# Patient Record
Sex: Male | Born: 1965 | Race: Black or African American | Hispanic: No | Marital: Single | Smoking: Current every day smoker
Health system: Southern US, Community
[De-identification: ages and names within clinical notes are randomized; demographics above are authoritative.]

## PROBLEM LIST (undated history)

## (undated) DIAGNOSIS — E039 Hypothyroidism, unspecified: Secondary | ICD-10-CM

## (undated) DIAGNOSIS — R55 Syncope and collapse: Secondary | ICD-10-CM

## (undated) HISTORY — PX: DENTAL RESTORATION/EXTRACTION WITH X-RAY: SHX5796

## (undated) HISTORY — DX: Syncope and collapse: R55

## (undated) HISTORY — DX: Hypothyroidism, unspecified: E03.9

---

## 2008-02-06 ENCOUNTER — Emergency Department (HOSPITAL_COMMUNITY): Admission: EM | Admit: 2008-02-06 | Discharge: 2008-02-06 | Payer: Self-pay | Admitting: Emergency Medicine

## 2008-03-21 ENCOUNTER — Ambulatory Visit: Payer: Self-pay | Admitting: Internal Medicine

## 2008-03-21 LAB — CONVERTED CEMR LAB
Albumin: 4.6 g/dL (ref 3.5–5.2)
BUN: 11 mg/dL (ref 6–23)
CO2: 25 meq/L (ref 19–32)
Calcium: 9.2 mg/dL (ref 8.4–10.5)
Creatinine, Ser: 1.61 mg/dL — ABNORMAL HIGH (ref 0.40–1.50)
Glucose, Bld: 100 mg/dL — ABNORMAL HIGH (ref 70–99)
Microalb, Ur: 0.94 mg/dL (ref 0.00–1.89)
Total Protein: 7.6 g/dL (ref 6.0–8.3)

## 2008-04-18 ENCOUNTER — Ambulatory Visit: Payer: Self-pay | Admitting: Internal Medicine

## 2008-04-18 LAB — CONVERTED CEMR LAB
ALT: 17 units/L (ref 0–53)
Alkaline Phosphatase: 88 units/L (ref 39–117)
Basophils Absolute: 0 10*3/uL (ref 0.0–0.1)
Basophils Relative: 0 % (ref 0–1)
Eosinophils Relative: 2 % (ref 0–5)
HCT: 46.4 % (ref 39.0–52.0)
Hemoglobin: 14.1 g/dL (ref 13.0–17.0)
Lymphocytes Relative: 28 % (ref 12–46)
Lymphs Abs: 1.9 10*3/uL (ref 0.7–4.0)
MCHC: 30.4 g/dL (ref 30.0–36.0)
Monocytes Absolute: 0.5 10*3/uL (ref 0.1–1.0)
Neutro Abs: 4.4 10*3/uL (ref 1.7–7.7)
Neutrophils Relative %: 63 % (ref 43–77)
Platelets: 242 10*3/uL (ref 150–400)
RBC: 5.2 M/uL (ref 4.22–5.81)
RDW: 15 % (ref 11.5–15.5)
Sodium: 141 meq/L (ref 135–145)
Total Protein: 7 g/dL (ref 6.0–8.3)
WBC: 6.9 10*3/uL (ref 4.0–10.5)

## 2010-01-20 ENCOUNTER — Inpatient Hospital Stay (HOSPITAL_COMMUNITY): Admission: EM | Admit: 2010-01-20 | Discharge: 2010-01-20 | Payer: Self-pay | Admitting: Emergency Medicine

## 2010-08-22 LAB — URINALYSIS, ROUTINE W REFLEX MICROSCOPIC
Ketones, ur: NEGATIVE mg/dL
Nitrite: NEGATIVE

## 2010-08-22 LAB — POCT I-STAT, CHEM 8
Calcium, Ion: 1.05 mmol/L — ABNORMAL LOW (ref 1.12–1.32)
Chloride: 108 mEq/L (ref 96–112)
Creatinine, Ser: 1.8 mg/dL — ABNORMAL HIGH (ref 0.4–1.5)
Potassium: 3.8 mEq/L (ref 3.5–5.1)
Sodium: 141 mEq/L (ref 135–145)

## 2010-08-22 LAB — CBC
Hemoglobin: 11.9 g/dL — ABNORMAL LOW (ref 13.0–17.0)
Hemoglobin: 14.8 g/dL (ref 13.0–17.0)
MCH: 29.3 pg (ref 26.0–34.0)
MCH: 29.7 pg (ref 26.0–34.0)
MCHC: 34.3 g/dL (ref 30.0–36.0)
MCV: 85.7 fL (ref 78.0–100.0)
RBC: 4.06 MIL/uL — ABNORMAL LOW (ref 4.22–5.81)
RBC: 4.98 MIL/uL (ref 4.22–5.81)
RDW: 13.3 % (ref 11.5–15.5)
WBC: 13.8 10*3/uL — ABNORMAL HIGH (ref 4.0–10.5)

## 2010-08-22 LAB — BASIC METABOLIC PANEL
CO2: 25 mEq/L (ref 19–32)
Calcium: 8 mg/dL — ABNORMAL LOW (ref 8.4–10.5)
Creatinine, Ser: 1.77 mg/dL — ABNORMAL HIGH (ref 0.4–1.5)
GFR calc non Af Amer: 42 mL/min — ABNORMAL LOW (ref >60)
Glucose, Bld: 107 mg/dL — ABNORMAL HIGH (ref 70–99)
Potassium: 4.1 mEq/L (ref 3.5–5.1)
Sodium: 143 mEq/L (ref 135–145)

## 2010-08-22 LAB — RAPID URINE DRUG SCREEN, HOSP PERFORMED
Amphetamines: NOT DETECTED
Barbiturates: NOT DETECTED
Tetrahydrocannabinol: NOT DETECTED

## 2010-08-22 LAB — DIFFERENTIAL
Basophils Absolute: 0 10*3/uL (ref 0.0–0.1)
Basophils Relative: 0 % (ref 0–1)
Eosinophils Absolute: 0 10*3/uL (ref 0.0–0.7)
Eosinophils Relative: 0 % (ref 0–5)
Lymphocytes Relative: 10 % — ABNORMAL LOW (ref 12–46)
Neutro Abs: 11.6 10*3/uL — ABNORMAL HIGH (ref 1.7–7.7)
Neutrophils Relative %: 84 % — ABNORMAL HIGH (ref 43–77)

## 2010-08-22 LAB — PROTIME-INR
INR: 1.04 (ref 0.00–1.49)
Prothrombin Time: 13.8 seconds (ref 11.6–15.2)

## 2010-08-22 LAB — URINE MICROSCOPIC-ADD ON

## 2010-08-22 LAB — TYPE AND SCREEN: Antibody Screen: NEGATIVE

## 2016-02-29 ENCOUNTER — Emergency Department (HOSPITAL_COMMUNITY)
Admission: EM | Admit: 2016-02-29 | Discharge: 2016-02-29 | Disposition: A | Discharge status: Home / Self Care | Payer: Worker's Compensation | Attending: Emergency Medicine | Admitting: Emergency Medicine

## 2016-02-29 ENCOUNTER — Encounter (HOSPITAL_COMMUNITY): Payer: Self-pay | Admitting: *Deleted

## 2016-02-29 DIAGNOSIS — Y9289 Other specified places as the place of occurrence of the external cause: Secondary | ICD-10-CM | POA: Insufficient documentation

## 2016-02-29 DIAGNOSIS — Z23 Encounter for immunization: Secondary | ICD-10-CM | POA: Diagnosis not present

## 2016-02-29 DIAGNOSIS — Y93E2 Activity, laundry: Secondary | ICD-10-CM | POA: Diagnosis not present

## 2016-02-29 DIAGNOSIS — S61232A Puncture wound without foreign body of right middle finger without damage to nail, initial encounter: Secondary | ICD-10-CM | POA: Diagnosis not present

## 2016-02-29 DIAGNOSIS — W461XXA Contact with contaminated hypodermic needle, initial encounter: Secondary | ICD-10-CM | POA: Insufficient documentation

## 2016-02-29 DIAGNOSIS — F17210 Nicotine dependence, cigarettes, uncomplicated: Secondary | ICD-10-CM | POA: Insufficient documentation

## 2016-02-29 DIAGNOSIS — Y990 Civilian activity done for income or pay: Secondary | ICD-10-CM | POA: Insufficient documentation

## 2016-02-29 DIAGNOSIS — IMO0001 Reserved for inherently not codable concepts without codable children: Secondary | ICD-10-CM

## 2016-02-29 DIAGNOSIS — S6981XA Other specified injuries of right wrist, hand and finger(s), initial encounter: Secondary | ICD-10-CM

## 2016-02-29 DIAGNOSIS — Z7721 Contact with and (suspected) exposure to potentially hazardous body fluids: Secondary | ICD-10-CM

## 2016-02-29 LAB — CBC WITH DIFFERENTIAL/PLATELET
Basophils Absolute: 0 10*3/uL (ref 0.0–0.1)
Basophils Relative: 0 %
EOS ABS: 0 10*3/uL (ref 0.0–0.7)
EOS PCT: 0 %
HCT: 47 % (ref 39.0–52.0)
Hemoglobin: 15 g/dL (ref 13.0–17.0)
LYMPHS ABS: 1.9 10*3/uL (ref 0.7–4.0)
Lymphocytes Relative: 22 %
MCH: 27.9 pg (ref 26.0–34.0)
MCHC: 31.9 g/dL (ref 30.0–36.0)
MCV: 87.5 fL (ref 78.0–100.0)
MONO ABS: 0.6 10*3/uL (ref 0.1–1.0)
MONOS PCT: 6 %
Neutro Abs: 6.2 10*3/uL (ref 1.7–7.7)
Neutrophils Relative %: 72 %
PLATELETS: 281 10*3/uL (ref 150–400)
RBC: 5.37 MIL/uL (ref 4.22–5.81)
RDW: 15.2 % (ref 11.5–15.5)
WBC: 8.8 10*3/uL (ref 4.0–10.5)

## 2016-02-29 LAB — COMPREHENSIVE METABOLIC PANEL
ALT: 21 U/L (ref 17–63)
AST: 31 U/L (ref 15–41)
Albumin: 4 g/dL (ref 3.5–5.0)
Alkaline Phosphatase: 85 U/L (ref 38–126)
Anion gap: 5 (ref 5–15)
BUN: 14 mg/dL (ref 6–20)
CHLORIDE: 107 mmol/L (ref 101–111)
CO2: 27 mmol/L (ref 22–32)
CREATININE: 1.29 mg/dL — AB (ref 0.61–1.24)
Calcium: 9.5 mg/dL (ref 8.9–10.3)
GFR calc Af Amer: >60 mL/min (ref >60)
Glucose, Bld: 96 mg/dL (ref 65–99)
Potassium: 4.5 mmol/L (ref 3.5–5.1)
Sodium: 139 mmol/L (ref 135–145)
Total Bilirubin: 0.4 mg/dL (ref 0.3–1.2)
Total Protein: 7.5 g/dL (ref 6.5–8.1)

## 2016-02-29 MED ORDER — EMTRICITABINE-TENOFOVIR AF 200-25 MG PO TABS
1.0000 | ORAL_TABLET | Freq: Every day | ORAL | Status: DC
  Administered 2016-02-29: 1 via ORAL
  Filled 2016-02-29: qty 1

## 2016-02-29 MED ORDER — TETANUS-DIPHTH-ACELL PERTUSSIS 5-2.5-18.5 LF-MCG/0.5 IM SUSP
0.5000 mL | Freq: Once | INTRAMUSCULAR | Status: AC
  Administered 2016-02-29: 0.5 mL via INTRAMUSCULAR
  Filled 2016-02-29: qty 0.5

## 2016-02-29 MED ORDER — DOLUTEGRAVIR SODIUM 50 MG PO TABS: 50.0000 mg | ORAL_TABLET | Freq: Every day | ORAL | 0 refills | Status: DC

## 2016-02-29 MED ORDER — DOLUTEGRAVIR SODIUM 50 MG PO TABS
50.0000 mg | ORAL_TABLET | Freq: Every day | ORAL | Status: DC
  Administered 2016-02-29: 50 mg via ORAL
  Filled 2016-02-29: qty 1

## 2016-02-29 MED ORDER — EMTRICITABINE-TENOFOVIR DF 200-300 MG PO TABS
1.0000 | ORAL_TABLET | Freq: Every day | ORAL | 0 refills | Status: DC
Start: 2016-02-29 — End: 2016-02-29

## 2016-02-29 MED ORDER — EMTRICITABINE-TENOFOVIR AF 200-25 MG PO TABS: 1.0000 | ORAL_TABLET | Freq: Every day | ORAL | 0 refills | Status: DC

## 2016-02-29 NOTE — ED Triage Notes (Signed)
PT reports he needs a tetanus shot. 

## 2016-02-29 NOTE — Discharge Instructions (Addendum)
You will need repeat blood testing in six weeks, three months, and six months following exposure. You should report any subsequent fever or mononucleosis-like illness so that they can be evaluated. You will also need testing to look for medication toxicity at  two and four weeks.  Please follow with your workers comp practitioner for these tests. If you have any issues do not hesitate to return to the emergency department.

## 2016-02-29 NOTE — ED Notes (Signed)
Declined W/C at D/C and was escorted to lobby by RN. 

## 2016-02-29 NOTE — ED Provider Notes (Signed)
[facility]-EMER[facility]ENCY DEPT Provider Note   CSN: [id number] Arrival date & time: 02/29/16  0808     History   Chief Complaint Chief Complaint  Patient presents with  . Body Fluid Exposure    HPI  Blood pressure 137/90, pulse 76, temperature 98 F (36.7 C), temperature source Oral, resp. rate 20, height 5\' 7"  (1.702 m), weight 81.6 kg, SpO2 100 %.  Brian Weber is a 50 y.o. male complaining of a needle fingerstick to right hand onset just prior to arrival. Patient works at a laundry facility which wanders hospital linens. He was taking the linen off the truck and there was a needle in the area. He clean the area with a ethanol wipe and presented to the ED for further evaluation. Last tetanus shot is unknown.   HPI  History reviewed. No pertinent past medical history.  There are no active problems to display for this patient.   History reviewed. No pertinent surgical history.     Home Medications    Prior to Admission medications   Medication Sig Start Date End Date Taking? Authorizing Provider  dolutegravir (TIVICAY) 50 MG tablet Take 1 tablet (50 mg total) by mouth daily. 02/29/16   Hye Trawick, PA-C  emtricitabine-tenofovir AF (DESCOVY) 200-25 MG tablet Take 1 tablet by mouth daily. 02/29/16   Joni Reining Kye Hedden, PA-C    Family History History reviewed. No pertinent family history.  Social History Social History  Substance Use Topics  . Smoking status: Current Every Day Smoker    Packs/day: 1.00    Years: 10.00    Types: Cigarettes  . Smokeless tobacco: Never Used  . Alcohol use No     Allergies   Review of patient's allergies indicates no known allergies.   Review of Systems Review of Systems  10 systems reviewed and found to be negative, except as noted in the HPI.   Physical Exam Updated Vital Signs BP 137/90 (BP Location: Right Arm)   Pulse 76   Temp 98 F (36.7 C) (Oral)   Resp 20   Ht 5\' 7"  (1.702 m)   Wt 81.6 kg   SpO2 100%   BMI 28.19  kg/m   Physical Exam  Constitutional: He is oriented to person, place, and time. He appears well-developed and well-nourished. No distress.  HENT:  Head: Normocephalic and atraumatic.  Mouth/Throat: Oropharynx is clear and moist.  Eyes: Conjunctivae and EOM are normal. Pupils are equal, round, and reactive to light.  Neck: Normal range of motion.  Cardiovascular: Normal rate, regular rhythm and intact distal pulses.   Pulmonary/Chest: Effort normal and breath sounds normal.  Abdominal: Soft. There is no tenderness.  Musculoskeletal: Normal range of motion.  Neurological: He is alert and oriented to person, place, and time.  Skin: He is not diaphoretic.  Fingerstick to right third digit PIP on the radial aspect with puncture wound and scant bleeding.  Psychiatric: He has a normal mood and affect.  Nursing note and vitals reviewed.    ED Treatments / Results  Labs (all labs ordered are listed, but only abnormal results are displayed) Labs Reviewed  COMPREHENSIVE METABOLIC PANEL - Abnormal; Notable for the following:       Result Value   Creatinine, Ser 1.29 (*)    All other components within normal limits  CBC WITH DIFFERENTIAL/PLATELET  HEPATITIS PANEL, ACUTE  HIV ANTIBODY (ROUTINE TESTING)    EKG  EKG Interpretation None       Radiology No results found.  Procedures Procedures (including  critical care time)  Medications Ordered in ED Medications  emtricitabine-tenofovir AF (DESCOVY) 200-25 MG per tablet 1 tablet (1 tablet Oral Given 02/29/16 1001)  dolutegravir (TIVICAY) tablet 50 mg (50 mg Oral Given 02/29/16 1001)  Tdap (BOOSTRIX) injection 0.5 mL (0.5 mLs Intramuscular Given 02/29/16 0831)     Initial Impression / Assessment and Plan / ED Course  I have reviewed the triage vital signs and the nursing notes.  Pertinent labs & imaging results that were available during my care of the patient were reviewed by me and considered in my medical decision making (see  chart for details).  Clinical Course    Vitals:   02/29/16 0820 02/29/16 0846 02/29/16 0947  BP:  156/80 137/90  Pulse:  82 76  Resp:  16 20  Temp:  97.3 F (36.3 C) 98 F (36.7 C)  TempSrc:  Oral Oral  SpO2:  100% 100%  Weight: 81.6 kg    Height: 5\' 7"  (1.702 m)      Medications  emtricitabine-tenofovir AF (DESCOVY) 200-25 MG per tablet 1 tablet (1 tablet Oral Given 02/29/16 1001)  dolutegravir (TIVICAY) tablet 50 mg (50 mg Oral Given 02/29/16 1001)  Tdap (BOOSTRIX) injection 0.5 mL (0.5 mLs Intramuscular Given 02/29/16 0831)    Brian Weber is 50 y.o. 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 Patient given the option of PEP and he adamantly would like HIV prophylaxis. Baseline HIV, hepatitis and chemistries pending.  Case discussed with pharmacist who recommends utilizing the new recommendations of Descovy and Tivicay. 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 Patient verbalized understanding.  Evaluation does not show pathology that would require ongoing emergent intervention or inpatient treatment. Pt is hemodynamically stable and mentating appropriately. Discussed findings and plan with patient/guardian, who agrees with care plan. All questions answered. Return precautions discussed and outpatient follow up given.      Final Clinical Impressions(s) / ED Diagnoses   Final diagnoses:  Needle stick injury of finger, right, initial encounter  Exposure to blood-borne pathogen    New Prescriptions Discharge Medication List as of 02/29/2016 11:17 AM    START taking these medications   Details  dolutegravir  (TIVICAY) 50 MG tablet Take 1 tablet (50 mg total) by mouth daily., Starting Sat 02/29/2016, Print    emtricitabine-tenofovir AF (DESCOVY) 200-25 MG tablet Take 1 tablet by mouth daily., Starting Sat 02/29/2016, Print    emtricitabine-tenofovir (TRUVADA) 200-300 MG tablet Take 1 tablet by mouth daily., Starting Sat 02/29/2016, [employer]         Barnard Sharps, PA-C 02/29/16 1426    Glynn OctaveStephen Rancour, MD 02/29/16 1719

## 2016-02-29 NOTE — ED Triage Notes (Signed)
PT reports he works for a [employer] company and was stuck with a dirty needle while at work. Pt reports he also needs a tetanus up date.

## 2016-02-29 NOTE — Care Management Note (Signed)
Case Management Note  Patient Details  Name: Graylon Gunningnthony Christo MRN: [id number] Date of Birth: 06-Oct-1965  Subjective/Objective:    CM consulted for assistance with post- exposure  Protocol for Lab/Medication follow-up for this 50 y.o. M who experienced a needle stick at his job in [employer].  Provider has written Rxs as directed by pharmacy as this pt will be covered by workers comp and should have no problems having his Rx filled. Understands that he needs to take his Rx as directed daily without missing any doses. CM [facility] will [facility] RX and it should be covered entirely. Pt also instructed to contact WC for instructions on follow up lab draws. Pt is [facility] if necessary. No voiced questions or concerns.               Action/Plan: CM will be available should additional concerns or questions arise.    Expected Discharge Date:                  Expected Discharge Plan:     In-House Referral:     Discharge planning Services  CM Consult  Post Acute Care Choice:  NA Choice offered to:  Patient  DME Arranged:  N/A DME Agency:  NA  HH Arranged:    HH Agency:     Status of Service:  Completed, signed off  If discussed at Long Length of Stay Meetings, dates discussed:    Additional Comments:  Yvone NeuCrutchfield, Laurie Lovejoy M, RN 02/29/2016, 11:46 AM

## 2016-03-01 LAB — HEPATITIS PANEL, ACUTE
HEP A IGM: NEGATIVE
HEP B C IGM: NEGATIVE
HEP B S AG: NEGATIVE

## 2016-03-01 LAB — HIV ANTIBODY (ROUTINE TESTING W REFLEX): HIV SCREEN 4TH GENERATION: NONREACTIVE

## 2016-05-24 ENCOUNTER — Emergency Department (HOSPITAL_COMMUNITY)
Admission: EM | Admit: 2016-05-24 | Discharge: 2016-05-24 | Disposition: A | Discharge status: Home / Self Care | Payer: Self-pay | Attending: Emergency Medicine | Admitting: Emergency Medicine

## 2016-05-24 ENCOUNTER — Encounter (HOSPITAL_COMMUNITY): Payer: Self-pay | Admitting: Emergency Medicine

## 2016-05-24 DIAGNOSIS — R197 Diarrhea, unspecified: Secondary | ICD-10-CM | POA: Insufficient documentation

## 2016-05-24 DIAGNOSIS — E860 Dehydration: Secondary | ICD-10-CM | POA: Insufficient documentation

## 2016-05-24 DIAGNOSIS — Z79899 Other long term (current) drug therapy: Secondary | ICD-10-CM | POA: Insufficient documentation

## 2016-05-24 DIAGNOSIS — F17210 Nicotine dependence, cigarettes, uncomplicated: Secondary | ICD-10-CM | POA: Insufficient documentation

## 2016-05-24 DIAGNOSIS — R112 Nausea with vomiting, unspecified: Secondary | ICD-10-CM

## 2016-05-24 LAB — COMPREHENSIVE METABOLIC PANEL
ALK PHOS: 93 U/L (ref 38–126)
ALT: 21 U/L (ref 17–63)
ANION GAP: 9 (ref 5–15)
AST: 28 U/L (ref 15–41)
Albumin: 4.7 g/dL (ref 3.5–5.0)
BILIRUBIN TOTAL: 1.4 mg/dL — AB (ref 0.3–1.2)
BUN: 16 mg/dL (ref 6–20)
CALCIUM: 9.5 mg/dL (ref 8.9–10.3)
CO2: 25 mmol/L (ref 22–32)
Chloride: 105 mmol/L (ref 101–111)
Creatinine, Ser: 1.28 mg/dL — ABNORMAL HIGH (ref 0.61–1.24)
GFR calc Af Amer: >60 mL/min (ref >60)
Glucose, Bld: 126 mg/dL — ABNORMAL HIGH (ref 65–99)
POTASSIUM: 4.3 mmol/L (ref 3.5–5.1)
Sodium: 139 mmol/L (ref 135–145)
TOTAL PROTEIN: 8.5 g/dL — AB (ref 6.5–8.1)

## 2016-05-24 LAB — URINALYSIS, ROUTINE W REFLEX MICROSCOPIC
BACTERIA UA: NONE SEEN
Bilirubin Urine: NEGATIVE
GLUCOSE, UA: NEGATIVE mg/dL
HGB URINE DIPSTICK: NEGATIVE
Ketones, ur: 5 mg/dL — AB
LEUKOCYTES UA: NEGATIVE
NITRITE: NEGATIVE
PH: 5 (ref 5.0–8.0)
PROTEIN: 100 mg/dL — AB
Specific Gravity, Urine: 1.034 — ABNORMAL HIGH (ref 1.005–1.030)

## 2016-05-24 LAB — LIPASE, BLOOD: Lipase: 67 U/L — ABNORMAL HIGH (ref 11–51)

## 2016-05-24 LAB — CBC
HEMATOCRIT: 47.4 % (ref 39.0–52.0)
HEMOGLOBIN: 16.3 g/dL (ref 13.0–17.0)
MCH: 29.1 pg (ref 26.0–34.0)
MCHC: 34.4 g/dL (ref 30.0–36.0)
MCV: 84.6 fL (ref 78.0–100.0)
Platelets: 227 10*3/uL (ref 150–400)
RBC: 5.6 MIL/uL (ref 4.22–5.81)
RDW: 14.8 % (ref 11.5–15.5)
WBC: 11.7 10*3/uL — AB (ref 4.0–10.5)

## 2016-05-24 MED ORDER — SODIUM CHLORIDE 0.9 % IV BOLUS (SEPSIS)
1000.0000 mL | Freq: Once | INTRAVENOUS | Status: AC
  Administered 2016-05-24: 1000 mL via INTRAVENOUS

## 2016-05-24 MED ORDER — GI COCKTAIL ~~LOC~~
30.0000 mL | Freq: Once | ORAL | Status: AC
  Administered 2016-05-24: 30 mL via ORAL
  Filled 2016-05-24: qty 30

## 2016-05-24 MED ORDER — ONDANSETRON HCL 4 MG/2ML IJ SOLN
4.0000 mg | Freq: Once | INTRAMUSCULAR | Status: AC
  Administered 2016-05-24: 4 mg via INTRAVENOUS
  Filled 2016-05-24: qty 2

## 2016-05-24 MED ORDER — ONDANSETRON HCL 4 MG PO TABS: 4.0000 mg | ORAL_TABLET | Freq: Three times a day (TID) | ORAL | 0 refills | Status: DC | PRN

## 2016-05-24 NOTE — ED Notes (Signed)
Bed: [id number] Expected date:  Expected time:  Means of arrival:  Comments: 50 yo N/V

## 2016-05-24 NOTE — ED Provider Notes (Signed)
[facility]-EMERGENCY DEPT Provider Note   CSN: [id number] Arrival date & time: 05/24/16  1436     History   Chief Complaint Chief Complaint  Patient presents with  . Emesis    HPI Brian Weber is a 50 y.o. male.  HPI 50 yo M with PMHx as below here with nausea, vomiting, and cramping. Pt states that he ate [place] steak, sweet potates and salad at the homeless shelter yesterday. He awoke this AM with mild epigastric abdominal pain that is aching, gnawing. His pain worsened just before arrival and he had several episodes of NBNB emesis throughout the day. Of note, while vomiting earlier this AM he had a near syncopal episode and felt lightheaded. Denis any CP or SOB. He recovered after vomiting. Has had similar sx in the past. He has also had mild diarrhea throughout the day. No dysuria or frequency. No rash.   History reviewed. No pertinent past medical history.  There are no active problems to display for this patient.   History reviewed. No pertinent surgical history.     Home Medications    Prior to Admission medications   Medication Sig Start Date End Date Taking? Authorizing Provider  dolutegravir (TIVICAY) 50 MG tablet Take 1 tablet (50 mg total) by mouth daily. 02/29/16   Nicole Pisciotta, PA-C  emtricitabine-tenofovir AF (DESCOVY) 200-25 MG tablet Take 1 tablet by mouth daily. 02/29/16   Nicole Pisciotta, PA-C  ondansetron (ZOFRAN) 4 MG tablet Take 1 tablet (4 mg total) by mouth every 8 (eight) hours as needed for nausea or vomiting. 05/24/16   Shaune Pollackameron Nadirah Socorro, MD    Family History History reviewed. No pertinent family history.  Social History Social History  Substance Use Topics  . Smoking status: Current Every Day Smoker    Packs/day: 2.00    Years: 10.00    Types: Cigarettes  . Smokeless tobacco: Never Used  . Alcohol use No     Allergies   Patient has no known allergies.   Review of Systems Review of Systems  Constitutional: Positive for fatigue.  Negative for chills and fever.  HENT: Negative for congestion and rhinorrhea.   Eyes: Negative for visual disturbance.  Respiratory: Negative for cough, shortness of breath and wheezing.   Cardiovascular: Negative for chest pain and leg swelling.  Gastrointestinal: Positive for diarrhea, nausea and vomiting. Negative for abdominal pain.  Genitourinary: Negative for dysuria and flank pain.  Musculoskeletal: Negative for neck pain and neck stiffness.  Skin: Negative for rash and wound.  Allergic/Immunologic: Negative for immunocompromised state.  Neurological: Negative for syncope, weakness and headaches.  All other systems reviewed and are negative.    Physical Exam Updated Vital Signs BP 135/76   Pulse 92   Temp 98.3 F (36.8 C) (Oral)   Resp 18   SpO2 100%   Physical Exam  Constitutional: He is oriented to person, place, and time. He appears well-developed and well-nourished. No distress.  HENT:  Head: Normocephalic and atraumatic.  Mouth/Throat: Oropharynx is clear and moist.  Eyes: Conjunctivae are normal.  Neck: Neck supple.  Cardiovascular: Regular rhythm and normal heart sounds.  Tachycardia present.  Exam reveals no friction rub.   No murmur heard. Pulmonary/Chest: Effort normal and breath sounds normal. No respiratory distress. He has no wheezes. He has no rales.  Abdominal: Soft. Normal appearance and bowel sounds are normal. He exhibits no distension. There is no rigidity, no rebound, no guarding, no CVA tenderness, no tenderness at McBurney's point and negative Murphy's sign.  Musculoskeletal: He exhibits no edema.  Neurological: He is alert and oriented to person, place, and time. He exhibits normal muscle tone.  Skin: Skin is warm. Capillary refill takes less than 2 seconds.  Psychiatric: He has a normal mood and affect.  Nursing note and vitals reviewed.    ED Treatments / Results  Labs (all labs ordered are listed, but only abnormal results are  displayed) Labs Reviewed  LIPASE, BLOOD - Abnormal; Notable for the following:       Result Value   Lipase 67 (*)    All other components within normal limits  COMPREHENSIVE METABOLIC PANEL - Abnormal; Notable for the following:    Glucose, Bld 126 (*)    Creatinine, Ser 1.28 (*)    Total Protein 8.5 (*)    Total Bilirubin 1.4 (*)    All other components within normal limits  CBC - Abnormal; Notable for the following:    WBC 11.7 (*)    All other components within normal limits  URINALYSIS, ROUTINE W REFLEX MICROSCOPIC - Abnormal; Notable for the following:    Color, Urine AMBER (*)    APPearance HAZY (*)    Specific Gravity, Urine 1.034 (*)    Ketones, ur 5 (*)    Protein, ur 100 (*)    Squamous Epithelial / LPF 0-5 (*)    All other components within normal limits  HEPATITIS PANEL, ACUTE    EKG  EKG Interpretation  Date/Time:  Sunday May 24 2016 14:52:19 EST Ventricular Rate:  96 PR Interval:    QRS Duration: 93 QT Interval:  331 QTC Calculation: 419 R Axis:   59 Text Interpretation:  Sinus rhythm Normal intervals No significant change since last tracing Confirmed by Stephani Janak MD, Sheria Lang [id number]) on 05/24/2016 5:39:05 PM       Radiology No results found.  Procedures Procedures (including critical care time)  Medications Ordered in ED Medications  sodium chloride 0.9 % bolus 1,000 mL (1,000 mLs Intravenous New Bag/Given 05/24/16 1729)  sodium chloride 0.9 % bolus 1,000 mL (0 mLs Intravenous Stopped 05/24/16 1731)  ondansetron (ZOFRAN) injection 4 mg (4 mg Intravenous Given 05/24/16 1619)  gi cocktail (Maalox,Lidocaine,Donnatal) (30 mLs Oral Given 05/24/16 1650)     Initial Impression / Assessment and Plan / ED Course  I have reviewed the triage vital signs and the nursing notes.  Pertinent labs & imaging results that were available during my care of the patient were reviewed by me and considered in my medical decision making (see chart for  details).  Clinical Course    50 yo M with PMHx as above here with n/v/d after eating steak last night. Of note, pt also has incidental needle stick injury months ago that he has not followed up for (cleans healthcare sheets). Suspect food borne illness vs viral GI illness. He also c/o intermittent nausea and given needlestick, will check hep panel that can f/u as otupt. Otherwise, no RLQ TTP or signs fo appendicitis. Lipase ordered.  Labs reviewed and are reassuring. CBC shows mild likely reactive leukocytosis. CMP at baseline. UA with dehydration and pt has been given 2L NS. His HR is improved. His abdomen remains soft and he is tolerating PO easily in ED. Will d/c with zofran, continued fluids, and PCP f/u.   Final Clinical Impressions(s) / ED Diagnoses   Final diagnoses:  Dehydration  Nausea vomiting and diarrhea    New Prescriptions New Prescriptions   ONDANSETRON (ZOFRAN) 4 MG TABLET    Take 1  tablet (4 mg total) by mouth every 8 (eight) hours as needed for nausea or vomiting.     Shaune Pollackameron Demetrus Pavao, MD 05/24/16 [id number]

## 2016-05-24 NOTE — ED Triage Notes (Addendum)
Per EMS pt c/o nausea and 2 episodes emesis onset today. Reports syncopal episode in shower at 0800 today. [facility] from homeless shelter. EMS administered 4 mg Zofran without relief. 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 Co-workers report same symptoms.

## 2016-05-25 LAB — HEPATITIS PANEL, ACUTE
HCV Ab: <0.1 s/co ratio (ref 0.0–0.9)
HEP B C IGM: NEGATIVE
Hep A IgM: NEGATIVE
Hepatitis B Surface Ag: NEGATIVE

## 2016-06-08 DIAGNOSIS — F3160 Bipolar disorder, current episode mixed, unspecified: Secondary | ICD-10-CM

## 2016-06-08 HISTORY — DX: Bipolar disorder, current episode mixed, unspecified: F31.60

## 2019-02-12 ENCOUNTER — Emergency Department (HOSPITAL_COMMUNITY)
Admission: EM | Admit: 2019-02-12 | Discharge: 2019-02-13 | Disposition: A | Discharge status: Home / Self Care | Payer: Self-pay | Attending: Emergency Medicine | Admitting: Emergency Medicine

## 2019-02-12 ENCOUNTER — Other Ambulatory Visit: Payer: Self-pay

## 2019-02-12 ENCOUNTER — Emergency Department (HOSPITAL_COMMUNITY): Payer: Self-pay

## 2019-02-12 DIAGNOSIS — R55 Syncope and collapse: Secondary | ICD-10-CM | POA: Insufficient documentation

## 2019-02-12 DIAGNOSIS — S01511A Laceration without foreign body of lip, initial encounter: Secondary | ICD-10-CM | POA: Insufficient documentation

## 2019-02-12 DIAGNOSIS — Y9389 Activity, other specified: Secondary | ICD-10-CM | POA: Insufficient documentation

## 2019-02-12 DIAGNOSIS — S2231XA Fracture of one rib, right side, initial encounter for closed fracture: Secondary | ICD-10-CM | POA: Insufficient documentation

## 2019-02-12 DIAGNOSIS — Y92003 Bedroom of unspecified non-institutional (private) residence as the place of occurrence of the external cause: Secondary | ICD-10-CM | POA: Insufficient documentation

## 2019-02-12 DIAGNOSIS — Z79899 Other long term (current) drug therapy: Secondary | ICD-10-CM | POA: Insufficient documentation

## 2019-02-12 DIAGNOSIS — Y999 Unspecified external cause status: Secondary | ICD-10-CM | POA: Insufficient documentation

## 2019-02-12 DIAGNOSIS — W1839XA Other fall on same level, initial encounter: Secondary | ICD-10-CM | POA: Insufficient documentation

## 2019-02-12 DIAGNOSIS — N179 Acute kidney failure, unspecified: Secondary | ICD-10-CM | POA: Insufficient documentation

## 2019-02-12 DIAGNOSIS — S0181XA Laceration without foreign body of other part of head, initial encounter: Secondary | ICD-10-CM | POA: Insufficient documentation

## 2019-02-12 DIAGNOSIS — S025XXA Fracture of tooth (traumatic), initial encounter for closed fracture: Secondary | ICD-10-CM | POA: Insufficient documentation

## 2019-02-12 LAB — BASIC METABOLIC PANEL
Anion gap: 11 (ref 5–15)
BUN: 15 mg/dL (ref 6–20)
CO2: 23 mmol/L (ref 22–32)
Calcium: 9 mg/dL (ref 8.9–10.3)
Chloride: 105 mmol/L (ref 98–111)
Creatinine, Ser: 1.67 mg/dL — ABNORMAL HIGH (ref 0.61–1.24)
GFR calc Af Amer: 53 mL/min — ABNORMAL LOW (ref >60)
GFR calc non Af Amer: 46 mL/min — ABNORMAL LOW (ref >60)
Glucose, Bld: 107 mg/dL — ABNORMAL HIGH (ref 70–99)
Potassium: 4 mmol/L (ref 3.5–5.1)
Sodium: 139 mmol/L (ref 135–145)

## 2019-02-12 LAB — CBC
HCT: 47.7 % (ref 39.0–52.0)
Hemoglobin: 14.6 g/dL (ref 13.0–17.0)
MCH: 26.4 pg (ref 26.0–34.0)
MCHC: 30.6 g/dL (ref 30.0–36.0)
MCV: 86.3 fL (ref 80.0–100.0)
Platelets: 305 10*3/uL (ref 150–400)
RBC: 5.53 MIL/uL (ref 4.22–5.81)
RDW: 14.9 % (ref 11.5–15.5)
WBC: 7.7 10*3/uL (ref 4.0–10.5)
nRBC: 0 % (ref 0.0–0.2)

## 2019-02-12 NOTE — ED Triage Notes (Signed)
Pt endorses taking his psych and hypothyroid meds this afternoon and then passing out, pt has lac from bite mark below lower lip. Axox4. No neuro deficits. States this has happened before. VSS.

## 2019-02-13 ENCOUNTER — Telehealth: Payer: Self-pay | Admitting: *Deleted

## 2019-02-13 ENCOUNTER — Emergency Department (HOSPITAL_COMMUNITY): Payer: Self-pay

## 2019-02-13 LAB — TSH: TSH: 4.93 u[IU]/mL — ABNORMAL HIGH (ref 0.350–4.500)

## 2019-02-13 LAB — TROPONIN I (HIGH SENSITIVITY)
Troponin I (High Sensitivity): 10 ng/L (ref <18)
Troponin I (High Sensitivity): 9 ng/L (ref <18)

## 2019-02-13 LAB — T4, FREE: Free T4: 1.05 ng/dL (ref 0.61–1.12)

## 2019-02-13 MED ORDER — SODIUM CHLORIDE 0.9 % IV BOLUS
1000.0000 mL | Freq: Once | INTRAVENOUS | Status: AC
  Administered 2019-02-13: 1000 mL via INTRAVENOUS

## 2019-02-13 MED ORDER — MORPHINE SULFATE (PF) 4 MG/ML IV SOLN
4.0000 mg | Freq: Once | INTRAVENOUS | Status: AC
  Administered 2019-02-13: 4 mg via INTRAVENOUS
  Filled 2019-02-13: qty 1

## 2019-02-13 MED ORDER — LIDOCAINE-EPINEPHRINE (PF) 2 %-1:200000 IJ SOLN
10.0000 mL | Freq: Once | INTRAMUSCULAR | Status: AC
  Administered 2019-02-13: 10 mL
  Filled 2019-02-13: qty 20

## 2019-02-13 MED ORDER — OXYCODONE-ACETAMINOPHEN 5-325 MG PO TABS: 1.0000 | ORAL_TABLET | Freq: Three times a day (TID) | ORAL | 0 refills | Status: DC | PRN

## 2019-02-13 NOTE — ED Provider Notes (Signed)
7:36 AM signout from [place] PA-C at shift change.  Patient with syncope and fall last night.  Patient sustained rib fracture without pneumothorax and facial laceration which was repaired.  Otherwise imaging of the head and maxillofacial areas were negative.  Patient is currently in the emergency department pending thyroid panel, fluids, second troponin given slightly changed EKG.  Plan: Discharge to home if studies are reassuring.  BP 130/70 (BP Location: Left Arm)   Pulse 75   Temp 98.6 F (37 C) (Oral)   Resp 20   Ht 5\' 7"  (1.702 m)   Wt 99.8 kg   SpO2 98%   BMI 34.46 kg/m   Orthostatic VS for the past 24 hrs:  BP- Lying Pulse- Lying BP- Sitting Pulse- Sitting BP- Standing at 0 minutes Pulse- Standing at 0 minutes  02/13/19 0450 123/73 74 129/85 77 (!) 124/112 79   10:12 AM TSH only slightly elevated. Troponin stable.   Discussed results with patient and family member at bedside.  Will give cardiology referral as this is the patient's third episode of syncope.  Also encourage PCP follow-up.  Pain medication written by previous provider.       Carlisle Cater, PA-C 02/13/19 [place], Gwenyth Allegra, MD 02/13/19 [phone]

## 2019-02-13 NOTE — ED Notes (Signed)
Pt returned from CT °

## 2019-02-13 NOTE — Discharge Instructions (Addendum)
Thank you for allowing me to care for you today in the Emergency Department.   Your thyroid test (TSH) was slightly high at 4.93.   Take 650 g of Tylenol once every 6 hours for pain control.  For severe pain, you can take 1 tablet of Percocet every 8 hours as needed.  Do not drink alcohol, work, drive, or take other sedating substances while you are taking Percocet.  It is a narcotic.  It can be addicting.  You can place a pillow under your right side to help with pain from your right rib fracture, especially if you have to cough and during sleep.  Use the incentive spirometer as you were shown in the ER.  To care for your wounds at home, clean the area with warm water and soap gently at least once daily.  You can cover the wound on your chin with a topical antibiotic, such as bacitracin.  Use caution in the sun because wounds can cause worsening scarring for several months.  Wearing a hat to cover the area can help with scar reduction as well as over-the-counter products, such as Mederma.  The sutures on your chin will need to be removed in 5 to 7 days.  You can have these removed at primary care, urgent care, or by returning to the ER.  The sutures in the wound in your mouth will dissolve on their own.  Try to gargle warm salt water every 6 hours until the wound closes to help prevent infection.  Return to the emergency department if you develop fever, chills, thick, mucus-like drainage from the wounds, if they get red or hot to the touch, or if you pass out again, develop severe chest pain, respiratory distress, or constant dizziness with difficulty walking, or other new, concerning symptoms.  See your doctor and the heart doctor listed for further evaluation of your recurrent passing out spells.

## 2019-02-13 NOTE — ED Provider Notes (Signed)
12:11 PM The initial pharmacy to which the Percocet prescription was sent is closed today being Labor Day.  Prescription resent to [facility] on [address]. Secretary will call initial pharmacy to cancel RX sent in by [employer].   Carlisle Cater, PA-C 02/13/19 1213    [place], Gwenyth Allegra, MD 02/13/19 1620

## 2019-02-13 NOTE — Telephone Encounter (Signed)
TOC  CM received call from [facility] needing a diagnosis code to fill Rx for Percocet. Provided code. [place], [place] ED TOC CM [phone]

## 2019-02-13 NOTE — ED Notes (Signed)
Mouth wound irrigated inside and out. Pt tolerated well. PA at bedside for sutures.

## 2019-02-13 NOTE — ED Notes (Signed)
Provided pt with inceptive spirometer. Education about use given. Pt verbalized understanding and demonstrated properly.  Pt ambulated around unit. Pt stated he felt only very slight dizziness and feels okay. Wants to go home and sleep.

## 2019-02-13 NOTE — ED Provider Notes (Signed)
[facility] EMERGENCY DEPARTMENT Provider Note   CSN: [id number] Arrival date & time: 02/12/19  1803     History   Chief Complaint Chief Complaint  Patient presents with   Loss of Consciousness    HPI Brian Weber is a 53 y.o. male with a history of hypothyroidism who presents to the emergency department with a chief complaint of syncope.  The patient reports that he was sitting on the edge of the bed when he suddenly felt nauseated and felt as if he was going to pass out.  He states that he attempted to ambulate to the restroom, and  the next thing that he recalls that he was laying in the floor.  He reports that he had another syncopal episode last week.  He states " I think I keep passing out because of my thyroid being low."   He reports that he has been feeling dizzy and lightheaded with standing over the last few days.  He states that he has to take his time and stand up slowly to avoid getting dizzy.  In the ER, he is endorsing pain to the chin and mouth as well as to the right ribs.  He also has a headache.  He denies neck pain, numbness, weakness, chest pain, shortness of breath, cough, leg swelling, palpitations visual changes, abdominal pain, nausea, vomiting, or diarrhea.  While sitting, he has no dizziness or lightheadedness at this time.  He reports that he had a syncopal episode last week as well.  States that he has been eating and drinking well.  He has been compliant with his home medications without missed doses.  No recent changes in his home indications.  He reports that he is on numerous medications for behavioral health problems that are managed by [facility].  Tdap was updated in 2017.  Denies alcohol use.  He smokes tobacco and marijuana.  No other IV recreational drug use.     The history is provided by the patient. No language interpreter was used.  Loss of Consciousness Associated symptoms: chest pain   Associated symptoms: no confusion,  no dizziness, no fever, no headaches, no nausea, no palpitations, no shortness of breath, no vomiting and no weakness     No past medical history on file.  There are no active problems to display for this patient.   No past surgical history on file.      Home Medications    Prior to Admission medications   Medication Sig Start Date End Date Taking? Authorizing Provider  benztropine (COGENTIN) 1 MG tablet Take 1 mg by mouth at bedtime.   Yes [provider]  hydrOXYzine (VISTARIL) 50 MG capsule Take 100 mg by mouth at bedtime.   Yes [provider]  levothyroxine (SYNTHROID) 75 MCG tablet Take 75 mcg by mouth daily before breakfast.   Yes [provider]  OLANZapine (ZYPREXA) 7.5 MG tablet Take 7.5 mg by mouth at bedtime.   Yes [provider]  venlafaxine XR (EFFEXOR-XR) 75 MG 24 hr capsule Take 225 mg by mouth at bedtime.   Yes [provider]  oxyCODONE-acetaminophen (PERCOCET/ROXICET) 5-325 MG tablet Take 1 tablet by mouth every 8 (eight) hours as needed for severe pain. 02/13/19   Shreya Lacasse A, PA-C    Family History No family history on file.  Social History Social History   Tobacco Use   Smoking status: Current Every Day Smoker    Packs/day: 2.00    Years: 10.00  Pack years: 20.00    Types: Cigarettes   Smokeless tobacco: Never Used  Substance Use Topics   Alcohol use: No   Drug use: Yes    Types: Marijuana     Allergies   Patient has no known allergies.   Review of Systems Review of Systems  Constitutional: Negative for appetite change and fever.  HENT: Positive for facial swelling. Negative for congestion, sinus pressure, sinus pain, sore throat and voice change.   Eyes: Negative for visual disturbance.  Respiratory: Negative for cough and shortness of breath.   Cardiovascular: Positive for chest pain and syncope. Negative for palpitations and leg swelling.  Gastrointestinal: Negative for abdominal  pain, diarrhea, nausea and vomiting.  Genitourinary: Negative for dysuria.  Musculoskeletal: Negative for back pain, myalgias, neck pain and neck stiffness.  Skin: Positive for wound. Negative for rash.  Allergic/Immunologic: Negative for immunocompromised state.  Neurological: Negative for dizziness, syncope, weakness, numbness and headaches.  Psychiatric/Behavioral: Negative for confusion.     Physical Exam Updated Vital Signs BP 130/70 (BP Location: Left Arm)    Pulse 75    Temp 98.6 F (37 C) (Oral)    Resp 20    Ht 5\' 7"  (1.702 m)    Wt 99.8 kg    SpO2 98%    BMI 34.46 kg/m   Physical Exam Vitals signs and nursing note reviewed.  Constitutional:      General: He is not in acute distress.    Appearance: He is well-developed. He is not ill-appearing, toxic-appearing or diaphoretic.  HENT:     Head: Normocephalic.     Jaw: There is normal jaw occlusion. No trismus, tenderness, swelling, pain on movement or malocclusion.     Comments: Several teeth are chipped, misaligned, and loose.    Occlusion of the jaw is normal.  No tenderness to the bilateral mandible.  No periorbital tenderness or tenderness to the bilateral cheeks.    Mouth/Throat:     Dentition: Dental tenderness present.      Comments: Teeth 6,7, and 8 are chipped.  Diffusely tender to palpation to the teeth.  Patient reports subjectively loose teeth, but I do not see any obvious loose teeth on exam.   Inferior alveolar process appears misaligned and recessed.  There is a 3 horizontal laceration noted to the anterior, inferior lip as well as a 3 cm horizontal laceration noted to the skin of the mental region.  Neither laceration crosses the vermilion border.  Wounds are hemostatic. Eyes:     Conjunctiva/sclera: Conjunctivae normal.  Neck:     Musculoskeletal: Neck supple.  Cardiovascular:     Rate and Rhythm: Normal rate and regular rhythm.     Heart sounds: Normal heart sounds. No murmur. No friction rub. No  gallop.   Pulmonary:     Effort: Pulmonary effort is normal. No respiratory distress.     Breath sounds: No stridor. No wheezing, rhonchi or rales.     Comments: Tender to palpation over the left inferior, anterolateral ribs no crepitus or step-offs are noted.  No splinting.  Breath sounds are clear and equal in all fields without adventitious breath sounds. Chest:     Chest wall: Tenderness present.  Abdominal:     General: There is distension.     Palpations: Abdomen is soft. There is no mass.     Tenderness: There is no abdominal tenderness. There is no right CVA tenderness, left CVA tenderness, guarding or rebound.     Hernia: No hernia  is present.     Comments: Abdomen is mildly distended, but soft and nontender.  Normoactive bowel sounds in all 4 quadrants.    Musculoskeletal:     Right lower leg: No edema.     Left lower leg: No edema.  Skin:    General: Skin is warm and dry.  Neurological:     Mental Status: He is alert.  Psychiatric:        Behavior: Behavior normal.      ED Treatments / Results  Labs (all labs ordered are listed, but only abnormal results are displayed) Labs Reviewed  BASIC METABOLIC PANEL - Abnormal; Notable for the following components:      Result Value   Glucose, Bld 107 (*)    Creatinine, Ser 1.67 (*)    GFR calc non Af Amer 46 (*)    GFR calc Af Amer 53 (*)    All other components within normal limits  CBC  T3, FREE  T4, FREE  TSH  TROPONIN I (HIGH SENSITIVITY)    EKG EKG Interpretation  Date/Time:  Monday February 13 2019 05:22:38 EDT Ventricular Rate:  68 PR Interval:  154 QRS Duration: 90 QT Interval:  364 QTC Calculation: 388 R Axis:   38 Text Interpretation:  Sinus rhythm Borderline repolarization abnormality No significant change was found Confirmed by Glynn Octave [phone]) on 02/13/2019 6:15:11 AM   Radiology Dg Ribs Unilateral W/chest Right  Result Date: 02/13/2019 CLINICAL DATA:  Syncope, fall EXAM: RIGHT RIBS AND  CHEST - 3+ VIEW COMPARISON:  None. FINDINGS: Nondisplaced fracture through the anterior right 10th rib. No additional visible fracture. Lungs clear. No effusions or pneumothorax. Heart is normal size. IMPRESSION: Nondisplaced fracture through the anterior right 10th rib. No associated effusion or pneumothorax. Electronically Signed   By: Charlett Nose M.D.   On: 02/13/2019 03:06   Ct Head Wo Contrast  Result Date: 02/12/2019 CLINICAL DATA:  Blunt trauma EXAM: CT HEAD WITHOUT CONTRAST TECHNIQUE: Contiguous axial images were obtained from the base of the skull through the vertex without intravenous contrast. COMPARISON:  None. FINDINGS: Brain: No evidence of acute territorial infarction, hemorrhage, hydrocephalus,extra-axial collection or mass lesion/mass effect. Normal gray-white differentiation. Ventricles are normal in size and contour. Vascular: No hyperdense vessel or unexpected calcification. Skull: The skull is intact. No fracture or focal lesion identified. Sinuses/Orbits: The visualized paranasal sinuses and mastoid air cells are clear. The orbits and globes intact. Other: None IMPRESSION: No acute intracranial pathology. Electronically Signed   By: Jonna Clark M.D.   On: 02/12/2019 21:19   Ct Maxillofacial Wo Contrast  Result Date: 02/13/2019 CLINICAL DATA:  Initial evaluation for acute trauma, fall. EXAM: CT MAXILLOFACIAL WITHOUT CONTRAST TECHNIQUE: Multidetector CT imaging of the maxillofacial structures was performed. Multiplanar CT image reconstructions were also generated. COMPARISON:  None. FINDINGS: Osseous: Zygomatic arches intact. No acute maxillary fracture. Pterygoid plates intact. Mild irregularity about the nasal bones is chronic in appearance. No acute nasal bone fracture. Nasal septum intact and relatively midline. Mandible intact. Mandibular condyles normally situated. No acute abnormality about the dentition. Visualized upper cervical spine intact. Orbits: Globes and orbital soft  tissues within normal limits. Bony orbits intact. Sinuses: Mild scattered mucosal thickening noted within the ethmoidal air cells. Paranasal sinuses are otherwise clear. No hemosinus. Soft tissues: Soft tissue laceration present at the lower lip along the midline. Superimposed 4 mm radiopaque foreign body (series 3, image 16). No other soft tissue injury about the face. Limited intracranial: Unremarkable. IMPRESSION: 1. Soft  tissue laceration at the lower lip with superimposed 4 mm radiopaque foreign body. 2. No other acute maxillofacial injury identified. No fracture. Electronically Signed   By: Jeannine Boga M.D.   On: 02/13/2019 05:32    Procedures .Marland KitchenLaceration Repair  Date/Time: 02/13/2019 6:27 AM Performed by: Joanne Gavel, PA-C Authorized by: Joanne Gavel, PA-C   Consent:    Consent obtained:  Verbal   Consent given by:  Patient   Risks discussed:  Infection, pain, retained foreign body, need for additional repair, poor cosmetic result, nerve damage and poor wound healing   Alternatives discussed:  No treatment Anesthesia (see MAR for exact dosages):    Anesthesia method:  Local infiltration   Local anesthetic:  Lidocaine 2% WITH epi Laceration details:    Location:  Face   Face location:  Chin   Length (cm):  3 Repair type:    Repair type:  Simple Pre-procedure details:    Preparation:  Patient was prepped and draped in usual sterile fashion and imaging obtained to evaluate for foreign bodies Exploration:    Wound exploration: wound explored through full range of motion and entire depth of wound probed and visualized     Wound extent: no areolar tissue violation noted, no fascia violation noted, no foreign bodies/material noted, no muscle damage noted, no nerve damage noted, no tendon damage noted, no underlying fracture noted and no vascular damage noted   Treatment:    Area cleansed with:  Saline   Amount of cleaning:  Extensive   Visualized foreign bodies/material  removed: yes   Skin repair:    Repair method:  Sutures   Suture size:  6-0   Suture material:  Prolene   Suture technique:  Simple interrupted   Number of sutures:  4 Approximation:    Approximation:  Close Post-procedure details:    Dressing:  Open (no dressing)   Patient tolerance of procedure:  Tolerated well, no immediate complications  .Marland KitchenLaceration Repair  Date/Time: 02/13/2019 6:38 AM Performed by: Joanne Gavel, PA-C Authorized by: Joanne Gavel, PA-C   Consent:    Consent obtained:  Verbal   Consent given by:  Patient   Risks discussed:  Infection, pain, retained foreign body, tendon damage, vascular damage, poor wound healing, poor cosmetic result, nerve damage and need for additional repair Anesthesia (see MAR for exact dosages):    Anesthesia method:  Local infiltration   Local anesthetic:  Lidocaine 2% WITH epi Laceration details:    Location:  Lip   Lip location:  Lower interior lip   Length (cm):  3 Repair type:    Repair type:  Simple Pre-procedure details:    Preparation:  Patient was prepped and draped in usual sterile fashion and imaging obtained to evaluate for foreign bodies Exploration:    Hemostasis achieved with:  Direct pressure   Wound exploration: wound explored through full range of motion and entire depth of wound probed and visualized     Wound extent: no fascia violation noted, no foreign bodies/material noted, no muscle damage noted, no nerve damage noted, no tendon damage noted, no underlying fracture noted and no vascular damage noted     Contaminated: no   Treatment:    Area cleansed with:  Saline   Amount of cleaning:  Standard Skin repair:    Repair method:  Sutures   Suture size:  4-0   Wound skin closure material used: Vicryl Rapide.   Suture technique:  Simple interrupted   Number of  sutures:  2 Approximation:    Approximation:  Close   Vermilion border: poorly aligned   Post-procedure details:    Dressing:  Open (no  dressing)   Patient tolerance of procedure:  Tolerated well, no immediate complications   (including critical care time)  Medications Ordered in ED Medications  lidocaine-EPINEPHrine (XYLOCAINE W/EPI) 2 %-1:200000 (PF) injection 10 mL (10 mLs Infiltration Given 02/13/19 0326)  sodium chloride 0.9 % bolus 1,000 mL (0 mLs Intravenous Stopped 02/13/19 0607)  morphine 4 MG/ML injection 4 mg (4 mg Intravenous Given 02/13/19 0326)  morphine 4 MG/ML injection 4 mg (4 mg Intravenous Given 02/13/19 0501)  sodium chloride 0.9 % bolus 1,000 mL (1,000 mLs Intravenous New Bag/Given 02/13/19 0656)     Initial Impression / Assessment and Plan / ED Course  I have reviewed the triage vital signs and the nursing notes.  Pertinent labs & imaging results that were available during my care of the patient were reviewed by me and considered in my medical decision making (see chart for details).        53 year old male with history of hypothyroidism presenting with a syncopal episode earlier today.  Syncopal episode was preceded by a prodrome. He has a wound on his chin that appears to be through and through with several loose, chipped, and misaligned teeth.  He is also endorsing right rib pain.  He reports he has been feeling dizzy and lightheaded with standing over the last few days and appears to have a mild AKI on his labs.  Head CT is unremarkable.  Will check CT maxillofacial, orthostatic vitals and order IV fluids for the patient as well as pain medication.  Laceration of the lower face will require repair.  Tdap was updated in 2017.  CT head and maxillofacial are unremarkable.  He has a right nondisplaced rib fracture on the right.  Incentive spirometer given.  He has a mild AKI, but labs are otherwise unremarkable.  The patient was seen and independently evaluated with Dr. Lajean Saverrancor, attending physician.  He does have new T wave inversion so delta troponin x2 has been ordered.  Lacerations repaired by me.  Wound  care instructions given.  After 1 L of fluids, the patient was mildly dizzy while ambulating.  Delta troponin is pending and a second liter of fluids has been ordered.  Continue dizziness could be secondary to 2 doses of morphine, but given this is his second syncopal episode in 1 week.  Patient care transferred to PA Geiple at the end of my shift pending delta troponin and ambulation after second liter of fluids.  Family is also requesting to have his thyroid hormone levels checked, which are pending. Patient presentation, ED course, and plan of care discussed with review of all pertinent labs and imaging. Please see his/her note for further details regarding further ED course and disposition.    Final Clinical Impressions(s) / ED Diagnoses   Final diagnoses:  Syncope, unspecified syncope type  Chin laceration, initial encounter  Lip laceration, initial encounter  Closed fracture of one rib of right side, initial encounter  AKI (acute kidney injury) [facility](HCC)    ED Discharge Orders         Ordered    oxyCODONE-acetaminophen (PERCOCET/ROXICET) 5-325 MG tablet  Every 8 hours PRN     02/13/19 0707           Barkley BoardsMcDonald, Ethelyne Erich A, PA-C 02/13/19 [id number]    Glynn Octaveancour, Stephen, MD 02/14/19 [id number]

## 2019-02-13 NOTE — ED Notes (Signed)
Pt to CT

## 2019-02-13 NOTE — ED Notes (Signed)
Gave patient a GINGER ALE and a cup of ICE WATER. Asked RN-KELLY first before giving to patient

## 2019-02-14 LAB — T3, FREE: T3, Free: 2.7 pg/mL (ref 2.0–4.4)

## 2019-02-17 ENCOUNTER — Other Ambulatory Visit: Payer: Self-pay

## 2019-02-17 ENCOUNTER — Ambulatory Visit (HOSPITAL_COMMUNITY)
Admission: EM | Admit: 2019-02-17 | Discharge: 2019-02-17 | Disposition: A | Discharge status: Home / Self Care | Payer: Self-pay | Attending: Emergency Medicine | Admitting: Emergency Medicine

## 2019-02-17 ENCOUNTER — Encounter (HOSPITAL_COMMUNITY): Payer: Self-pay

## 2019-02-17 DIAGNOSIS — Z5189 Encounter for other specified aftercare: Secondary | ICD-10-CM

## 2019-02-17 DIAGNOSIS — L089 Local infection of the skin and subcutaneous tissue, unspecified: Secondary | ICD-10-CM

## 2019-02-17 DIAGNOSIS — W19XXXA Unspecified fall, initial encounter: Secondary | ICD-10-CM

## 2019-02-17 DIAGNOSIS — S01511A Laceration without foreign body of lip, initial encounter: Secondary | ICD-10-CM

## 2019-02-17 MED ORDER — AMOXICILLIN-POT CLAVULANATE 875-125 MG PO TABS: 1.0000 | ORAL_TABLET | Freq: Two times a day (BID) | ORAL | 0 refills | Status: AC

## 2019-02-17 MED ORDER — CHLORHEXIDINE GLUCONATE 0.12 % MT SOLN: 5.0000 mL | Freq: Two times a day (BID) | OROMUCOSAL | 0 refills | Status: DC

## 2019-02-17 NOTE — Discharge Instructions (Signed)
Cleanse daily with soap and water.  Complete course of antibiotics.  Oral antibiotic mouth rinse, swish and spit twice a day.  Return in 3-4 days for recheck and for suture removal.  Return sooner for any worsening of symptoms.

## 2019-02-17 NOTE — ED Triage Notes (Signed)
Patient presents to Urgent Care with complaints of needing his stiches on his chin out since falling on his face 5 days ago. Patient's wound appears infected, yellow-green drainage noted around wound and stitches are difficult to visualize under the drainage. Patient willing to stay and be evaluated for possible infection by provider.

## 2019-02-18 NOTE — ED Provider Notes (Signed)
[facility]    CSN: [id number] Arrival date & time: 02/17/19  1019      History   Chief Complaint Chief Complaint  Patient presents with  . Wound Check    HPI Brian Weber is a 53 y.o. male.   Brian Weber presents for follow up wound check. He fell on 9/6, syncopal episode, causing his lower teeth to penetrate through his lower lip. He was seen and evaluated in the ER, was discharged. Sutures placed to lower lip. He has since had increased pain to the lip and has had some yellow drainage. Still with some swelling. No fevers. Without contributing medical history.     ROS per HPI, negative if not otherwise mentioned.      History reviewed. No pertinent past medical history.  There are no active problems to display for this patient.   History reviewed. No pertinent surgical history.     Home Medications    Prior to Admission medications   Medication Sig Start Date End Date Taking? Authorizing Provider  amoxicillin-clavulanate (AUGMENTIN) 875-125 MG tablet Take 1 tablet by mouth every 12 (twelve) hours for 7 days. 02/17/19 02/24/19  Georgetta HaberBurky, Natalie B, NP  benztropine (COGENTIN) 1 MG tablet Take 1 mg by mouth at bedtime.    [provider]  chlorhexidine (PERIDEX) 0.12 % solution Use as directed 5 mLs in the mouth or throat 2 (two) times daily. Swish and spit 02/17/19   Linus MakoBurky, Natalie B, NP  hydrOXYzine (VISTARIL) 50 MG capsule Take 100 mg by mouth at bedtime.    [provider]  levothyroxine (SYNTHROID) 75 MCG tablet Take 75 mcg by mouth daily before breakfast.    [provider]  OLANZapine (ZYPREXA) 7.5 MG tablet Take 7.5 mg by mouth at bedtime.    [provider]  oxyCODONE-acetaminophen (PERCOCET/ROXICET) 5-325 MG tablet Take 1 tablet by mouth every 8 (eight) hours as needed for severe pain. 02/13/19   Renne CriglerGeiple, Joshua, PA-C  venlafaxine XR (EFFEXOR-XR) 75 MG 24 hr capsule Take 225 mg by mouth at bedtime.    [provider]    Family History Family History  Problem Relation Age of Onset  . Diabetes Mother   . Healthy Father     Social History Social History   Tobacco Use  . Smoking status: Current Every Day Smoker    Packs/day: 0.30    Years: 10.00    Pack years: 3.00    Types: Cigarettes  . Smokeless tobacco: Never Used  Substance Use Topics  . Alcohol use: No  . Drug use: Yes    Types: Marijuana     Allergies   Patient has no known allergies.   Review of Systems Review of Systems   Physical Exam Triage Vital Signs ED Triage Vitals  Enc Vitals Group     BP 02/17/19 1116 134/84     Pulse Rate 02/17/19 1116 65     Resp 02/17/19 1116 18     Temp 02/17/19 1116 98.1 F (36.7 C)     Temp Source 02/17/19 1116 Temporal     SpO2 02/17/19 1116 100 %     Weight --      Height --      Head Circumference --      Peak Flow --      Pain Score 02/17/19 1114 10     Pain Loc --      Pain Edu? --      Excl. in GC? --  No data found.  Updated Vital Signs BP 134/84 (BP Location: Right Arm)   Pulse 65   Temp 98.1 F (36.7 C) (Temporal)   Resp 18   SpO2 100%    Physical Exam Constitutional:      Appearance: He is well-developed.  HENT:     Head:      Comments: Through and through laceration to lower lip; white coating to inside of lip; external lower lip with yellow crusting and drainage, very moist wound, only 1 suture visualized at this time; tender Cardiovascular:     Rate and Rhythm: Normal rate.  Pulmonary:     Effort: Pulmonary effort is normal.  Skin:    General: Skin is warm and dry.  Neurological:     Mental Status: He is alert and oriented to person, place, and time.        UC Treatments / Results  Labs (all labs ordered are listed, but only abnormal results are displayed) Labs Reviewed - No data to display  EKG   Radiology No results found.  Procedures Procedures (including critical care time)  Medications Ordered in UC  Medications - No data to display  Initial Impression / Assessment and Plan / UC Course  I have reviewed the triage vital signs and the nursing notes.  Pertinent labs & imaging results that were available during my care of the patient were reviewed by me and considered in my medical decision making (see chart for details).   I am concerned about infection to this wound, external lower lip does not appear to be well healing at this point. augmentin provided and recommended return in 3-4 days for recheck for consideration of suture removal at that point. peridex provided as well. Wound care discussed. Return precautions provided. Patient verbalized understanding and agreeable to plan.   Final Clinical Impressions(s) / UC Diagnoses   Final diagnoses:  Wound infection  Visit for wound check     Discharge Instructions     Cleanse daily with soap and water.  Complete course of antibiotics.  Oral antibiotic mouth rinse, swish and spit twice a day.  Return in 3-4 days for recheck and for suture removal.  Return sooner for any worsening of symptoms.    ED Prescriptions    Medication Sig Dispense Auth. Provider   amoxicillin-clavulanate (AUGMENTIN) 875-125 MG tablet Take 1 tablet by mouth every 12 (twelve) hours for 7 days. 14 tablet Augusto Gamble B, NP   chlorhexidine (PERIDEX) 0.12 % solution Use as directed 5 mLs in the mouth or throat 2 (two) times daily. Swish and spit 120 mL Augusto Gamble B, NP     Controlled Substance Prescriptions [place] Controlled Substance Registry consulted? Not Applicable   Zigmund Gottron, NP 02/18/19 1035

## 2019-04-07 ENCOUNTER — Inpatient Hospital Stay: Payer: Self-pay | Admitting: Family Medicine

## 2019-04-16 ENCOUNTER — Emergency Department (HOSPITAL_COMMUNITY)
Admission: EM | Admit: 2019-04-16 | Discharge: 2019-04-17 | Disposition: A | Discharge status: Home / Self Care | Payer: Self-pay | Attending: Emergency Medicine | Admitting: Emergency Medicine

## 2019-04-16 DIAGNOSIS — E039 Hypothyroidism, unspecified: Secondary | ICD-10-CM | POA: Insufficient documentation

## 2019-04-16 DIAGNOSIS — R55 Syncope and collapse: Secondary | ICD-10-CM | POA: Insufficient documentation

## 2019-04-16 DIAGNOSIS — Z79899 Other long term (current) drug therapy: Secondary | ICD-10-CM | POA: Insufficient documentation

## 2019-04-16 DIAGNOSIS — F17210 Nicotine dependence, cigarettes, uncomplicated: Secondary | ICD-10-CM | POA: Insufficient documentation

## 2019-04-16 LAB — URINALYSIS, ROUTINE W REFLEX MICROSCOPIC
Bacteria, UA: NONE SEEN
Bilirubin Urine: NEGATIVE
Glucose, UA: NEGATIVE mg/dL
Hgb urine dipstick: NEGATIVE
Ketones, ur: NEGATIVE mg/dL
Leukocytes,Ua: NEGATIVE
Nitrite: NEGATIVE
Protein, ur: NEGATIVE mg/dL
Specific Gravity, Urine: 1.005 (ref 1.005–1.030)
pH: 6 (ref 5.0–8.0)

## 2019-04-16 LAB — BASIC METABOLIC PANEL
Anion gap: 12 (ref 5–15)
BUN: 11 mg/dL (ref 6–20)
CO2: 22 mmol/L (ref 22–32)
Calcium: 9.1 mg/dL (ref 8.9–10.3)
Chloride: 102 mmol/L (ref 98–111)
Creatinine, Ser: 1.66 mg/dL — ABNORMAL HIGH (ref 0.61–1.24)
GFR calc Af Amer: 53 mL/min — ABNORMAL LOW (ref >60)
GFR calc non Af Amer: 46 mL/min — ABNORMAL LOW (ref >60)
Glucose, Bld: 152 mg/dL — ABNORMAL HIGH (ref 70–99)
Potassium: 3.8 mmol/L (ref 3.5–5.1)
Sodium: 136 mmol/L (ref 135–145)

## 2019-04-16 LAB — CBC
HCT: 43.4 % (ref 39.0–52.0)
Hemoglobin: 13.6 g/dL (ref 13.0–17.0)
MCH: 26.8 pg (ref 26.0–34.0)
MCHC: 31.3 g/dL (ref 30.0–36.0)
MCV: 85.4 fL (ref 80.0–100.0)
Platelets: 268 10*3/uL (ref 150–400)
RBC: 5.08 MIL/uL (ref 4.22–5.81)
RDW: 16.5 % — ABNORMAL HIGH (ref 11.5–15.5)
WBC: 9.9 10*3/uL (ref 4.0–10.5)
nRBC: 0 % (ref 0.0–0.2)

## 2019-04-16 MED ORDER — SODIUM CHLORIDE 0.9% FLUSH: 3.0000 mL | Freq: Once | INTRAVENOUS | Status: DC

## 2019-04-16 NOTE — ED Triage Notes (Signed)
Patient complains of syncopal event today at 1700 while sitting on porch, friend reports just lasted a few seconds. Had similar event 1 month ago and had facial injury and rib fractures. Alert and oriented, NAD

## 2019-04-17 NOTE — ED Notes (Signed)
Pt able to ambulate with no difficulties.

## 2019-04-17 NOTE — Progress Notes (Signed)
Patient ID: Brian Weber, male   DOB: January 04, 1966, 53 y.o.   MRN: [id number] Virtual Visit via Telephone Note  I connected with Brian Weber on 04/19/19 at  1:50 PM EST by telephone and verified that I am speaking with the correct person using two identifiers.   I discussed the limitations, risks, security and privacy concerns of performing an evaluation and management service by telephone and the availability of in person appointments. I also discussed with the patient that there may be a patient responsible charge related to this service. The patient expressed understanding and agreed to proceed.  Patient location:  home My Location:  [facility], [place] office Persons on the call:  Me and the patient    History of Present Illness: ED 04/16/2019 for syncopal episode.  It is believed that this was related to dehydration in the setting of THC use.  He denies CP.  No HA.  No vision changes.  Admits to poor water intake.  He has not had another episode since the ED.  He continues to have some pain since a rib fracture in September.  No N/V/D.    From ed note: Brian Weber was evaluated in Emergency Department on 04/17/2019 for the symptoms described in the history of present illness. He was evaluated in the context of the global COVID-19 pandemic, which necessitated consideration that the patient might be at risk for infection with the SARS-CoV-2 virus that causes COVID-19. Institutional protocols and algorithms that pertain to the evaluation of patients at risk for COVID-19 are in a state of rapid change based on information released by regulatory bodies including the CDC and federal and state organizations. These policies and algorithms were followed during the patient's care in the ED.  Syncopal episode. EKG without acute ischemic changes, dysrhythmias, blocks, Brugada, HOCM. Labs reassuring without anemia, electrolyte derangements or change in renal function.  Orthostatics reassuring.  On arrival  patient had soft BPs.  Has been hydrating orally while in the waiting room.  This might have been to vasovagal in the setting of marijuana use with dehydration.  The patient appears reasonably screened and/or stabilized for discharge and I doubt any other medical condition or other [facility] requiring further screening, evaluation, or treatment in the ED at this time prior to discharge.    Observations/Objective:  NAD.  A&Ox3   Assessment and Plan: 1. Hyperglycemia I have had a lengthy discussion and provided education about insulin resistance and the intake of too much sugar/refined carbohydrates.  I have advised the patient to work at a goal of eliminating sugary drinks, candy, desserts, sweets, refined sugars, processed foods, and white carbohydrates.  The patient expresses understanding.  - Hemoglobin A1c; Future  2. Abnormal kidney function - Basic metabolic panel; Future  3. Syncope, unspecified syncope type Believed to be dehydration in the setting of marijuana use.  Drink 8 cups water daily.  Call 911 if further episodes - Ambulatory referral to Cardiology  4. Hospital discharge follow-up No further episodes - Ambulatory referral to Cardiology   Follow Up Instructions: Assign PCP in 1 month   I discussed the assessment and treatment plan with the patient. The patient was provided an opportunity to ask questions and all were answered. The patient agreed with the plan and demonstrated an understanding of the instructions.   The patient was advised to call back or seek an in-person evaluation if the symptoms worsen or if the condition fails to improve as anticipated.  I provided 14 minutes of non-face-to-face time during  this encounter.   Freeman Caldron, PA-C

## 2019-04-17 NOTE — ED Provider Notes (Signed)
[facility] EMERGENCY DEPARTMENT Provider Note  CSN: [id number] Arrival date & time: 04/16/19 1738  Chief Complaint(s) No chief complaint on file.  HPI Mackey Varricchio is a 53 y.o. male with a history of hypothyroidism on Synthroid, who presents to the emergency department for syncopal episode.  Patient reports that he was sitting at home on the porch when he felt lightheaded and passed out.  Patient reports smoking marijuana prior to this.  Denies any associated chest pain or shortness of breath.  Episode lasted for several seconds and patient came to.  No seizure activity.  Patient has had 4 syncopal episodes in the past.  He believes is related to his hypothyroidism.  Currently denies any chest pain or shortness of breath.  No abdominal pain.  Denies any recent nausea or vomiting.  No diarrhea.  No other illicit drug use.  Currently no physical complaints.  HPI  Past Medical History No past medical history on file. There are no active problems to display for this patient.  Home Medication(s) Prior to Admission medications   Medication Sig Start Date End Date Taking? Authorizing Provider  benztropine (COGENTIN) 1 MG tablet Take 1 mg by mouth at bedtime.    [provider]  chlorhexidine (PERIDEX) 0.12 % solution Use as directed 5 mLs in the mouth or throat 2 (two) times daily. Swish and spit 02/17/19   Linus Mako B, NP  hydrOXYzine (VISTARIL) 50 MG capsule Take 100 mg by mouth at bedtime.    [provider]  levothyroxine (SYNTHROID) 75 MCG tablet Take 75 mcg by mouth daily before breakfast.    [provider]  OLANZapine (ZYPREXA) 7.5 MG tablet Take 7.5 mg by mouth at bedtime.    [provider]  oxyCODONE-acetaminophen (PERCOCET/ROXICET) 5-325 MG tablet Take 1 tablet by mouth every 8 (eight) hours as needed for severe pain. 02/13/19   Renne Crigler, PA-C  venlafaxine XR (EFFEXOR-XR) 75 MG 24 hr capsule Take 225 mg by mouth at  bedtime.    [provider]                                                                                                                                    Past Surgical History No past surgical history on file. Family History Family History  Problem Relation Age of Onset  . Diabetes Mother   . Healthy Father     Social History Social History   Tobacco Use  . Smoking status: Current Every Day Smoker    Packs/day: 0.30    Years: 10.00    Pack years: 3.00    Types: Cigarettes  . Smokeless tobacco: Never Used  Substance Use Topics  . Alcohol use: No  . Drug use: Yes    Types: Marijuana   Allergies Patient has no known allergies.  Review of Systems Review of Systems All other systems are reviewed and are negative for acute  change except as noted in the HPI  Physical Exam Vital Signs  I have reviewed the triage vital signs BP 130/87   Pulse 79   Temp 98.4 F (36.9 C) (Oral)   Resp 19   SpO2 97%   Physical Exam Vitals signs reviewed.  Constitutional:      General: He is not in acute distress.    Appearance: He is well-developed. He is not diaphoretic.  HENT:     Head: Normocephalic and atraumatic.     Nose: Nose normal.  Eyes:     General: No scleral icterus.       Right eye: No discharge.        Left eye: No discharge.     Conjunctiva/sclera: Conjunctivae normal.     Pupils: Pupils are equal, round, and reactive to light.  Neck:     Musculoskeletal: Normal range of motion and neck supple.  Cardiovascular:     Rate and Rhythm: Normal rate and regular rhythm.     Heart sounds: No murmur. No friction rub. No gallop.   Pulmonary:     Effort: Pulmonary effort is normal. No respiratory distress.     Breath sounds: Normal breath sounds. No stridor. No rales.  Abdominal:     General: There is no distension.     Palpations: Abdomen is soft.     Tenderness: There is no abdominal tenderness.  Musculoskeletal:        General: No tenderness.  Skin:     General: Skin is warm and dry.     Findings: No erythema or rash.  Neurological:     Mental Status: He is alert and oriented to person, place, and time.     ED Results and Treatments Labs (all labs ordered are listed, but only abnormal results are displayed) Labs Reviewed  BASIC METABOLIC PANEL - Abnormal; Notable for the following components:      Result Value   Glucose, Bld 152 (*)    Creatinine, Ser 1.66 (*)    GFR calc non Af Amer 46 (*)    GFR calc Af Amer 53 (*)    All other components within normal limits  CBC - Abnormal; Notable for the following components:   RDW 16.5 (*)    All other components within normal limits  URINALYSIS, ROUTINE W REFLEX MICROSCOPIC - Abnormal; Notable for the following components:   Color, Urine STRAW (*)    All other components within normal limits  CBG MONITORING, ED                                                                                                                         EKG  EKG Interpretation  Date/Time:  Monday April 17 2019 05:01:15 EST Ventricular Rate:  77 PR Interval:    QRS Duration: 92 QT Interval:  366 QTC Calculation: 415 R Axis:   66 Text Interpretation: Sinus rhythm Borderline ST elevation, anterior leads improve T wave changes. Confirmed by ,   [phone]([id number]) on 04/17/2019 5:21:03 AM      Radiology No results found.  Pertinent labs & imaging results that were available during my care of the patient were reviewed by me and considered in my medical decision making (see chart for details).  Medications Ordered in ED Medications  sodium chloride flush (NS) 0.9 % injection 3 mL (has no administration in time range)                                                                                                                                    Procedures Procedures  (including critical care time)  Medical Decision Making / ED Course I have reviewed the nursing notes for this encounter and the  patient's prior records (if available in EHR or on provided paperwork).   Graylon Gunningnthony Duffus was evaluated in Emergency Department on 04/17/2019 for the symptoms described in the history of present illness. He was evaluated in the context of the global COVID-19 pandemic, which necessitated consideration that the patient might be at risk for infection with the SARS-CoV-2 virus that causes COVID-19. Institutional protocols and algorithms that pertain to the evaluation of patients at risk for COVID-19 are in a state of rapid change based on information released by regulatory bodies including the CDC and federal and state organizations. These policies and algorithms were followed during the patient's care in the ED.  Syncopal episode. EKG without acute ischemic changes, dysrhythmias, blocks, Brugada, HOCM. Labs reassuring without anemia, electrolyte derangements or change in renal function.  Orthostatics reassuring.  On arrival patient had soft BPs.  Has been hydrating orally while in the waiting room.  This might have been to vasovagal in the setting of marijuana use with dehydration.  The patient appears reasonably screened and/or stabilized for discharge and I doubt any other medical condition or other [facility] requiring further screening, evaluation, or treatment in the ED at this time prior to discharge.  The patient is safe for discharge with strict return precautions.       Final Clinical Impression(s) / ED Diagnoses Final diagnoses:  Vasovagal episode     The patient appears reasonably screened and/or stabilized for discharge and I doubt any other medical condition or other [facility] requiring further screening, evaluation, or treatment in the ED at this time prior to discharge.  Disposition: Discharge  Condition: Good  I have discussed the results, Dx and Tx plan with the patient who expressed understanding and agree(s) with the plan. Discharge instructions discussed at great length. The  patient was given strict return precautions who verbalized understanding of the instructions. No further questions at time of discharge.    ED Discharge Orders    None       Follow Up: Primary care provider  Schedule an appointment as soon as possible for a visit       This chart was dictated using voice recognition software.  Despite best efforts to  proofread,  errors can occur which can change the documentation meaning.   Fatima Blank, MD 04/17/19 [phone]

## 2019-04-17 NOTE — ED Notes (Signed)
Pt is requesting to speak to pharmacy to go over medications and dosage once in the room

## 2019-04-17 NOTE — ED Notes (Signed)
Pt advised against fluid and food but pt requested water 

## 2019-04-19 ENCOUNTER — Ambulatory Visit: Payer: Self-pay | Attending: Family Medicine | Admitting: Physician Assistant

## 2019-04-19 ENCOUNTER — Other Ambulatory Visit: Payer: Self-pay

## 2019-04-19 DIAGNOSIS — Z09 Encounter for follow-up examination after completed treatment for conditions other than malignant neoplasm: Secondary | ICD-10-CM

## 2019-04-19 DIAGNOSIS — N289 Disorder of kidney and ureter, unspecified: Secondary | ICD-10-CM

## 2019-04-19 DIAGNOSIS — R55 Syncope and collapse: Secondary | ICD-10-CM

## 2019-04-19 DIAGNOSIS — R739 Hyperglycemia, unspecified: Secondary | ICD-10-CM

## 2019-04-19 MED ORDER — MELOXICAM 7.5 MG PO TABS: 7.5000 mg | ORAL_TABLET | Freq: Every day | ORAL | 0 refills | Status: DC

## 2019-04-24 ENCOUNTER — Other Ambulatory Visit: Payer: Self-pay

## 2019-04-24 ENCOUNTER — Ambulatory Visit (INDEPENDENT_AMBULATORY_CARE_PROVIDER_SITE_OTHER): Payer: Self-pay | Admitting: Cardiology

## 2019-04-24 ENCOUNTER — Encounter: Payer: Self-pay | Admitting: Cardiology

## 2019-04-24 DIAGNOSIS — R55 Syncope and collapse: Secondary | ICD-10-CM

## 2019-04-24 NOTE — Patient Instructions (Addendum)
Medication Instructions:   NOT NEEDED   Lab Work: NOT NEEDED   Testing/Procedures: Will be schedule at [place] has requested that you have an echocardiogram. Echocardiography is a painless test that uses sound waves to create images of your heart. It provides your doctor with information about the size and shape of your heart and how well your heart's chambers and valves are working. This procedure takes approximately one hour. There are no restrictions for this procedure.  AND Your physician has recommended that you wear an event monitor-30 DAYS. Event monitors are medical devices that record the heart's electrical activity. Doctors most often [place] these monitors to diagnose arrhythmias. Arrhythmias are problems with the speed or rhythm of the heartbeat. The monitor is a small, portable device. You can wear one while you do your normal daily activities. This is usually used to diagnose what is causing palpitations/syncope (passing out).   WILL AT [place]  Your physician has requested that you have a carotid duplex. This test is an ultrasound of the carotid arteries in your neck. It looks at blood flow through these arteries that supply the brain with blood. Allow one hour for this exam. There are no restrictions or special instructions.  Follow-Up: At [facility], you and your health needs are our priority.  As part of our continuing mission to provide you with exceptional heart care, we have created designated Provider Care Teams.  These Care Teams include your primary Cardiologist (physician) and Advanced Practice Providers (APPs -  Physician Assistants and Nurse Practitioners) who all work together to provide you with the care you need, when you need it.  Your next appointment:   2 MONTH-JAN2021  The format for your next appointment:   In Person  Provider:   Glenetta Hew, MD  Other Instructions   YOU MAY NOT DRIVE  FOR 6 MONTHS  FROM THE LAST TIME YOU HAD FAINTING SPELL--- AFTER HAVING ANOTHER PASS OUT SPELL [place])
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
## 2019-04-26 ENCOUNTER — Encounter: Payer: Self-pay | Admitting: Cardiology

## 2019-04-26 NOTE — Assessment & Plan Note (Addendum)
Probably the most likely etiology is vasovagal syncope. The initial episode had a been after going to the bathroom would be classic for reflex syncope from micturition, however this was before he actually went to the bathroom.  It was however shortly after getting up and that could have led him to have orthostatic hypotension. His orthostatics here were relatively normal as were there in the ER.  But that does not mean in the morning it would not be different.  The second episode was little bit more unusual and that he was sitting in a chair.  Could potentially be related to the marijuana however we cannot improve it.  We may need to consider tilt table test however I think I would like to do some other evaluation first.  One glaring potential consideration is the combination of venlafaxine and olanzapine both of which could have syncope or orthostasis as side effect.  This may require some further thinking and potentially consideration of changing medications.  Plan for now will be to check 30-day event monitor and 2D echo.  I did hear an S4 gallop which could be indicative of some type of structural abnormality.  We will check carotid Dopplers more for vertebral artery disease.  Encouraged adequate hydration, avoiding standing up very quickly.  Also informed him that with an unexplained syncopal episode especially 2 of them in a row, legally we are compelled to inform him that he should not drive for 6 months or until a definitive cause is found.

## 2019-05-08 ENCOUNTER — Other Ambulatory Visit: Payer: Self-pay

## 2019-05-08 ENCOUNTER — Ambulatory Visit (HOSPITAL_COMMUNITY): Payer: Self-pay | Attending: Cardiology

## 2019-05-08 DIAGNOSIS — R55 Syncope and collapse: Secondary | ICD-10-CM | POA: Insufficient documentation

## 2019-05-09 ENCOUNTER — Ambulatory Visit (HOSPITAL_COMMUNITY)
Admission: RE | Admit: 2019-05-09 | Discharge: 2019-05-09 | Disposition: A | Discharge status: Home / Self Care | Payer: Self-pay | Source: Ambulatory Visit | Attending: Cardiology | Admitting: Cardiology

## 2019-05-09 DIAGNOSIS — R55 Syncope and collapse: Secondary | ICD-10-CM | POA: Insufficient documentation

## 2019-05-10 ENCOUNTER — Telehealth: Payer: Self-pay | Admitting: *Deleted

## 2019-05-10 NOTE — Telephone Encounter (Signed)
Offered patient to enroll for [employer] cardiac event monitor at their discounted self pay rate of $225.00.  Patient declined monitor at this time.

## 2019-05-12 ENCOUNTER — Telehealth: Payer: Self-pay | Admitting: Cardiology

## 2019-05-12 NOTE — Telephone Encounter (Signed)
Okay; I guess we will see if he has any arrhythmias to explain passout spells.  Glenetta Hew, MD

## 2019-05-19 ENCOUNTER — Other Ambulatory Visit: Payer: Self-pay

## 2019-05-19 ENCOUNTER — Ambulatory Visit: Payer: Self-pay | Attending: Family Medicine | Admitting: Family Medicine

## 2019-05-19 ENCOUNTER — Encounter: Payer: Self-pay | Admitting: Family Medicine

## 2019-05-19 DIAGNOSIS — G8929 Other chronic pain: Secondary | ICD-10-CM

## 2019-05-19 DIAGNOSIS — Z789 Other specified health status: Secondary | ICD-10-CM

## 2019-05-19 DIAGNOSIS — R55 Syncope and collapse: Secondary | ICD-10-CM

## 2019-05-19 DIAGNOSIS — F3160 Bipolar disorder, current episode mixed, unspecified: Secondary | ICD-10-CM

## 2019-05-19 DIAGNOSIS — R0789 Other chest pain: Secondary | ICD-10-CM

## 2019-05-19 DIAGNOSIS — Z79899 Other long term (current) drug therapy: Secondary | ICD-10-CM

## 2019-05-19 DIAGNOSIS — E039 Hypothyroidism, unspecified: Secondary | ICD-10-CM | POA: Insufficient documentation

## 2019-05-19 DIAGNOSIS — N1831 Chronic kidney disease, stage 3a: Secondary | ICD-10-CM | POA: Insufficient documentation

## 2019-05-19 DIAGNOSIS — R739 Hyperglycemia, unspecified: Secondary | ICD-10-CM | POA: Insufficient documentation

## 2019-05-19 MED ORDER — LEVOTHYROXINE SODIUM 75 MCG PO TABS: 75.0000 ug | ORAL_TABLET | Freq: Every day | ORAL | 3 refills | Status: DC

## 2019-05-19 MED ORDER — MELOXICAM 7.5 MG PO TABS: 7.5000 mg | ORAL_TABLET | Freq: Every day | ORAL | 0 refills | Status: DC

## 2019-05-19 NOTE — Progress Notes (Signed)
Patient has been called and DOB has been verified. Patient has been screened and transferred to PCP to start phone visit.     
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
## 2019-05-23 ENCOUNTER — Other Ambulatory Visit: Payer: Self-pay

## 2019-05-23 ENCOUNTER — Ambulatory Visit: Payer: Self-pay | Attending: Family Medicine

## 2019-05-23 DIAGNOSIS — F3160 Bipolar disorder, current episode mixed, unspecified: Secondary | ICD-10-CM

## 2019-05-23 DIAGNOSIS — N1831 Chronic kidney disease, stage 3a: Secondary | ICD-10-CM

## 2019-05-23 DIAGNOSIS — R739 Hyperglycemia, unspecified: Secondary | ICD-10-CM

## 2019-05-23 DIAGNOSIS — Z79899 Other long term (current) drug therapy: Secondary | ICD-10-CM

## 2019-05-23 DIAGNOSIS — E039 Hypothyroidism, unspecified: Secondary | ICD-10-CM

## 2019-05-23 DIAGNOSIS — R55 Syncope and collapse: Secondary | ICD-10-CM

## 2019-05-24 LAB — BASIC METABOLIC PANEL WITH GFR
BUN/Creatinine Ratio: 6 — ABNORMAL LOW (ref 9–20)
BUN: 9 mg/dL (ref 6–24)
CO2: 23 mmol/L (ref 20–29)
Calcium: 9.4 mg/dL (ref 8.7–10.2)
Chloride: 105 mmol/L (ref 96–106)
Creatinine, Ser: 1.49 mg/dL — ABNORMAL HIGH (ref 0.76–1.27)
GFR calc Af Amer: 61 mL/min/1.73
GFR calc non Af Amer: 52 mL/min/1.73 — ABNORMAL LOW
Glucose: 114 mg/dL — ABNORMAL HIGH (ref 65–99)
Potassium: 4.7 mmol/L (ref 3.5–5.2)
Sodium: 143 mmol/L (ref 134–144)

## 2019-05-24 LAB — THYROID PANEL WITH TSH
Free Thyroxine Index: 2.2 (ref 1.2–4.9)
T3 Uptake Ratio: 31 % (ref 24–39)
T4, Total: 7.2 ug/dL (ref 4.5–12.0)
TSH: 2.76 u[IU]/mL (ref 0.450–4.500)

## 2019-05-24 LAB — HEMOGLOBIN A1C
Est. average glucose Bld gHb Est-mCnc: 123 mg/dL
Hgb A1c MFr Bld: 5.9 % — ABNORMAL HIGH (ref 4.8–5.6)

## 2019-06-26 ENCOUNTER — Other Ambulatory Visit: Payer: Self-pay

## 2019-06-26 ENCOUNTER — Ambulatory Visit: Payer: Self-pay | Attending: Family Medicine | Admitting: Licensed Clinical Social Worker

## 2019-06-26 DIAGNOSIS — F439 Reaction to severe stress, unspecified: Secondary | ICD-10-CM

## 2019-07-04 ENCOUNTER — Ambulatory Visit: Payer: Self-pay | Admitting: Cardiology
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
## 2019-07-04 NOTE — BH Specialist Note (Signed)
Integrated Behavioral Health Visit via Telemedicine (Telephone)  06/26/2019 Brian Weber [id number]   Session Start time: 9:30 AM  Session End time: 9:45 AM Total time: 15  Referring Provider: Dr. Jillyn Hidden Type of Visit: Telephonic Patient location: [facility] Provider location: Office All persons participating in visit: LCSW and Patient  Confirmed patient's address: Yes  Confirmed patient's phone number: Yes  Any changes to demographics: No   Confirmed patient's insurance: Yes  Any changes to patient's insurance: No   Discussed confidentiality: Yes    The following statements were read to the patient and/or legal guardian that are established with the [facility] Provider.  "The purpose of this phone visit is to provide behavioral health care while limiting exposure to the coronavirus (COVID19).  There is a possibility of technology failure and discussed alternative modes of communication if that failure occurs."  "By engaging in this telephone visit, you consent to the provision of healthcare.  Additionally, you authorize for your insurance to be billed for the services provided during this telephone visit."   Patient and/or legal guardian consented to telephone visit: Yes   PRESENTING CONCERNS: Patient and/or family reports the following symptoms/concerns: Pt reports difficulty managing ongoing medical conditions. He is currently uninsured and is interested in CAFA Duration of problem: Ongoing; Severity of problem: na  STRENGTHS (Protective Factors/Coping Skills): Pt is interested in services to assist with medical costs  GOALS ADDRESSED: Patient will: 1.  Reduce symptoms of: stress  2.  Increase knowledge and/or ability of: self-management skills  3.  Demonstrate ability to: Increase healthy adjustment to current life circumstances and Increase adequate support systems for patient/family  INTERVENTIONS: Interventions utilized:  Solution-Focused Strategies,  Supportive Counseling and Psychoeducation and/or Health Education Standardized Assessments completed: Not Needed  ASSESSMENT: Patient currently experiencing stress triggered by ongoing medical conditions. Pt is uninsured.    Patient may benefit from continued therapy and medication management through [facility]. LCSW discussed benefits of applying for financial assistance through [facility], if medicaid application is denied. Pt denies any additional resource needs.   PLAN: 1. Follow up with behavioral health clinician on : Contact LCSW with any additional resource or behavioral health needs 2. Behavioral recommendations: Schedule financial counseling appointment if medicaid application has been denied 3. Referral(s): [facility] (In Clinic)  Bridgett Larsson, [place] 07/04/2019 9:14 AM

## 2019-07-25 NOTE — Telephone Encounter (Signed)
None encounter.

## 2019-08-23 ENCOUNTER — Encounter: Payer: Self-pay | Admitting: Cardiology

## 2020-06-17 ENCOUNTER — Encounter: Payer: Medicaid Other | Admitting: Nurse Practitioner

## 2020-06-17 ENCOUNTER — Ambulatory Visit: Payer: Medicaid Other | Admitting: Nurse Practitioner

## 2020-06-21 ENCOUNTER — Ambulatory Visit: Payer: Medicaid Other | Admitting: Internal Medicine

## 2020-08-14 DIAGNOSIS — H2513 Age-related nuclear cataract, bilateral: Secondary | ICD-10-CM | POA: Diagnosis not present

## 2020-08-14 DIAGNOSIS — H40033 Anatomical narrow angle, bilateral: Secondary | ICD-10-CM | POA: Diagnosis not present

## 2020-08-30 DIAGNOSIS — H2512 Age-related nuclear cataract, left eye: Secondary | ICD-10-CM | POA: Diagnosis not present

## 2020-08-30 DIAGNOSIS — H2513 Age-related nuclear cataract, bilateral: Secondary | ICD-10-CM | POA: Diagnosis not present

## 2020-09-18 ENCOUNTER — Other Ambulatory Visit: Payer: Self-pay

## 2020-09-18 ENCOUNTER — Encounter: Payer: Self-pay | Admitting: Critical Care Medicine

## 2020-09-18 ENCOUNTER — Ambulatory Visit: Payer: Medicaid Other | Attending: Critical Care Medicine | Admitting: Critical Care Medicine

## 2020-09-18 VITALS — BP 135/80 | HR 87 | Ht 66.0 in | Wt 185.0 lb

## 2020-09-18 DIAGNOSIS — R21 Rash and other nonspecific skin eruption: Secondary | ICD-10-CM | POA: Diagnosis not present

## 2020-09-18 DIAGNOSIS — S46219A Strain of muscle, fascia and tendon of other parts of biceps, unspecified arm, initial encounter: Secondary | ICD-10-CM | POA: Diagnosis not present

## 2020-09-18 DIAGNOSIS — Y939 Activity, unspecified: Secondary | ICD-10-CM | POA: Diagnosis not present

## 2020-09-18 DIAGNOSIS — K029 Dental caries, unspecified: Secondary | ICD-10-CM | POA: Insufficient documentation

## 2020-09-18 DIAGNOSIS — E039 Hypothyroidism, unspecified: Secondary | ICD-10-CM | POA: Diagnosis not present

## 2020-09-18 DIAGNOSIS — S46211A Strain of muscle, fascia and tendon of other parts of biceps, right arm, initial encounter: Secondary | ICD-10-CM | POA: Insufficient documentation

## 2020-09-18 DIAGNOSIS — F1290 Cannabis use, unspecified, uncomplicated: Secondary | ICD-10-CM | POA: Insufficient documentation

## 2020-09-18 DIAGNOSIS — K056 Periodontal disease, unspecified: Secondary | ICD-10-CM | POA: Insufficient documentation

## 2020-09-18 DIAGNOSIS — Z79899 Other long term (current) drug therapy: Secondary | ICD-10-CM | POA: Diagnosis not present

## 2020-09-18 DIAGNOSIS — Y929 Unspecified place or not applicable: Secondary | ICD-10-CM | POA: Diagnosis not present

## 2020-09-18 DIAGNOSIS — F3160 Bipolar disorder, current episode mixed, unspecified: Secondary | ICD-10-CM | POA: Diagnosis not present

## 2020-09-18 DIAGNOSIS — H539 Unspecified visual disturbance: Secondary | ICD-10-CM | POA: Insufficient documentation

## 2020-09-18 DIAGNOSIS — Z720 Tobacco use: Secondary | ICD-10-CM

## 2020-09-18 DIAGNOSIS — M791 Myalgia, unspecified site: Secondary | ICD-10-CM | POA: Insufficient documentation

## 2020-09-18 DIAGNOSIS — F17210 Nicotine dependence, cigarettes, uncomplicated: Secondary | ICD-10-CM | POA: Insufficient documentation

## 2020-09-18 DIAGNOSIS — Z139 Encounter for screening, unspecified: Secondary | ICD-10-CM | POA: Insufficient documentation

## 2020-09-18 DIAGNOSIS — Z791 Long term (current) use of non-steroidal anti-inflammatories (NSAID): Secondary | ICD-10-CM | POA: Diagnosis not present

## 2020-09-18 MED ORDER — LEVOTHYROXINE SODIUM 75 MCG PO TABS: 75.0000 ug | ORAL_TABLET | Freq: Every day | ORAL | 3 refills | Status: DC

## 2020-09-18 MED ORDER — VENLAFAXINE HCL ER 75 MG PO CP24: 225.0000 mg | ORAL_CAPSULE | Freq: Every day | ORAL | 2 refills | Status: DC

## 2020-09-18 MED ORDER — OLANZAPINE 7.5 MG PO TABS: 7.5000 mg | ORAL_TABLET | Freq: Every day | ORAL | 2 refills | Status: DC

## 2020-09-18 MED ORDER — CLOBETASOL PROPIONATE 0.05 % EX CREA: 1.0000 "application " | TOPICAL_CREAM | Freq: Two times a day (BID) | CUTANEOUS | 1 refills | Status: DC

## 2020-09-18 MED ORDER — BENZTROPINE MESYLATE 1 MG PO TABS: 1.0000 mg | ORAL_TABLET | Freq: Every day | ORAL | 1 refills | Status: DC

## 2020-09-18 MED ORDER — NICOTINE POLACRILEX 4 MG MT LOZG: LOZENGE | OROMUCOSAL | 4 refills | Status: DC

## 2020-09-18 NOTE — Assessment & Plan Note (Signed)
Plan to refill Zyprexa Cogentin and Effexor patient will follow up with [facility]

## 2020-09-18 NOTE — Assessment & Plan Note (Signed)
Temovate cream prescribed will be mixed with skin moisturizer

## 2020-09-18 NOTE — Patient Instructions (Signed)
labs today for primary care screening labs  Refills on your medicine sent [facility] the [facility]  Use the steroid cream sent Temovate and apply it to a good skin moisturizer such as Cerave or Eucerin cream twice a day  Please keep your appointment with [facility]  Dental care resources given  Work on tobacco reduction using nicotine lozenges prescription sent to the [facility] pharmacy  Return to see Dr. Delford Field 3 months

## 2020-09-18 NOTE — Assessment & Plan Note (Signed)
Hypothyroidism refill Synthroid follow-up thyroid function
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
## 2020-09-18 NOTE — Assessment & Plan Note (Signed)
Right biceps tendon tear refer to orthopedics

## 2020-09-18 NOTE — Assessment & Plan Note (Signed)
Will do complete health screen

## 2020-09-18 NOTE — Assessment & Plan Note (Signed)
  .   Current smoking consumption amount: 1 pack every 3 days  . Dicsussion on advise to quit smoking and smoking impacts: Cardiovascular impacts  . Patient's willingness to quit: Willing to quit  . Methods to quit smoking discussed: Behavioral modification and nicotine lozenge  . Medication management of smoking session drugs discussed: Nicotine replacement  . Resources provided:  AVS   . Setting quit date not established  . Follow-up arranged 2 months  Time spent counseling the patient: 5 minutes

## 2020-09-18 NOTE — Progress Notes (Signed)
Dental problems

## 2020-09-19 ENCOUNTER — Other Ambulatory Visit: Payer: Self-pay | Admitting: Critical Care Medicine

## 2020-09-19 ENCOUNTER — Telehealth: Payer: Self-pay

## 2020-09-19 DIAGNOSIS — R7303 Prediabetes: Secondary | ICD-10-CM

## 2020-09-19 LAB — CBC WITH DIFFERENTIAL/PLATELET
Basophils Absolute: 0 10*3/uL (ref 0.0–0.2)
Basos: 0 %
EOS (ABSOLUTE): 0.1 10*3/uL (ref 0.0–0.4)
Eos: 1 %
Hematocrit: 43.8 % (ref 37.5–51.0)
Hemoglobin: 14.3 g/dL (ref 13.0–17.7)
Immature Grans (Abs): 0 10*3/uL (ref 0.0–0.1)
Immature Granulocytes: 0 %
Lymphocytes Absolute: 1.8 10*3/uL (ref 0.7–3.1)
Lymphs: 25 %
MCH: 28.3 pg (ref 26.6–33.0)
MCHC: 32.6 g/dL (ref 31.5–35.7)
MCV: 87 fL (ref 79–97)
Monocytes Absolute: 0.6 10*3/uL (ref 0.1–0.9)
Monocytes: 9 %
Neutrophils Absolute: 4.7 10*3/uL (ref 1.4–7.0)
Neutrophils: 65 %
Platelets: 269 10*3/uL (ref 150–450)
RBC: 5.06 x10E6/uL (ref 4.14–5.80)
RDW: 14.3 % (ref 11.6–15.4)
WBC: 7.2 10*3/uL (ref 3.4–10.8)

## 2020-09-19 LAB — COMPREHENSIVE METABOLIC PANEL
ALT: 12 IU/L (ref 0–44)
AST: 22 IU/L (ref 0–40)
Albumin/Globulin Ratio: 1.5 (ref 1.2–2.2)
Albumin: 4.6 g/dL (ref 3.8–4.9)
Alkaline Phosphatase: 108 IU/L (ref 44–121)
BUN/Creatinine Ratio: 7 — ABNORMAL LOW (ref 9–20)
BUN: 10 mg/dL (ref 6–24)
Bilirubin Total: 0.5 mg/dL (ref 0.0–1.2)
CO2: 22 mmol/L (ref 20–29)
Calcium: 9.3 mg/dL (ref 8.7–10.2)
Chloride: 102 mmol/L (ref 96–106)
Creatinine, Ser: 1.52 mg/dL — ABNORMAL HIGH (ref 0.76–1.27)
Globulin, Total: 3.1 g/dL (ref 1.5–4.5)
Glucose: 109 mg/dL — ABNORMAL HIGH (ref 65–99)
Potassium: 4.1 mmol/L (ref 3.5–5.2)
Sodium: 140 mmol/L (ref 134–144)
Total Protein: 7.7 g/dL (ref 6.0–8.5)
eGFR: 53 mL/min/{1.73_m2} — ABNORMAL LOW (ref >59)

## 2020-09-19 LAB — LIPID PANEL
Chol/HDL Ratio: 2.8 ratio (ref 0.0–5.0)
Cholesterol, Total: 146 mg/dL (ref 100–199)
HDL: 53 mg/dL (ref >39)
LDL Chol Calc (NIH): 79 mg/dL (ref 0–99)
Triglycerides: 71 mg/dL (ref 0–149)
VLDL Cholesterol Cal: 14 mg/dL (ref 5–40)

## 2020-09-19 LAB — HEMOGLOBIN A1C
Est. average glucose Bld gHb Est-mCnc: 120 mg/dL
Hgb A1c MFr Bld: 5.8 % — ABNORMAL HIGH (ref 4.8–5.6)

## 2020-09-19 NOTE — Telephone Encounter (Signed)
Contacted pt to go over lab results pt is aware and doesn't have any questions or concerns 

## 2020-09-26 DIAGNOSIS — H2512 Age-related nuclear cataract, left eye: Secondary | ICD-10-CM | POA: Diagnosis not present

## 2020-09-27 DIAGNOSIS — H25011 Cortical age-related cataract, right eye: Secondary | ICD-10-CM | POA: Diagnosis not present

## 2020-09-27 DIAGNOSIS — H25041 Posterior subcapsular polar age-related cataract, right eye: Secondary | ICD-10-CM | POA: Diagnosis not present

## 2020-09-27 DIAGNOSIS — H2511 Age-related nuclear cataract, right eye: Secondary | ICD-10-CM | POA: Diagnosis not present

## 2020-10-04 ENCOUNTER — Ambulatory Visit (INDEPENDENT_AMBULATORY_CARE_PROVIDER_SITE_OTHER): Payer: Medicaid Other | Admitting: Orthopedic Surgery

## 2020-10-04 ENCOUNTER — Encounter: Payer: Self-pay | Admitting: Orthopedic Surgery

## 2020-10-04 DIAGNOSIS — S46211A Strain of muscle, fascia and tendon of other parts of biceps, right arm, initial encounter: Secondary | ICD-10-CM | POA: Diagnosis not present

## 2020-10-04 NOTE — Progress Notes (Signed)
Office Visit Note   Patient: Brian Weber           Date of Birth: 1965/08/11           MRN: [id number] Visit Date: 10/04/2020 Requested by: Storm Frisk, MD [address] [place],  [place] [address] PCP: Storm Frisk, MD  Subjective: Chief Complaint  Patient presents with  . Right Shoulder - Pain    HPI: Brian Weber is a 55 year old right-hand-dominant patient with right upper extremity injury 6 months ago.  He states I have a torn biceps.  Taking ibuprofen.  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  Reports severe pain at times but not all the time.  He does report deformity in the arm.  He describes some weakness with activity but he does not have weakness or pain all the time.              ROS: All systems reviewed are negative as they relate to the chief complaint within the history of present illness.  Patient denies  fevers or chills.   Assessment & Plan: Visit Diagnoses:  1. 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  Not too much of a noticeable difference on manual muscle testing.  This would be a difficult procedure to undertake with predictable results due to the chronicity of the injury.  Based on his clinical strength I am not sure we can definitively improve him.  Really need to find out how far that tendon is retracted and whether or not its even present at this time before considering any type of procedure.  We will see what the retraction is and potentially plan for either operative allograft interpositional grafting or continued nonoperative treatment.  Follow-up after MRI scan.  Follow-Up Instructions: Return for after MRI.   Orders:  Orders Placed This Encounter  Procedures  . MR HUMERUS RIGHT WO CONTRAST   No orders of the defined types were placed in this encounter.      Procedures: No procedures performed   Clinical Data: No additional findings.  Objective: Vital Signs: There were no vitals taken for this visit.  Physical Exam:   Constitutional: Patient appears well-developed HEENT:  Head: Normocephalic Eyes:EOM are normal Neck: Normal range of motion Cardiovascular: Normal rate Pulmonary/chest: Effort normal Neurologic: Patient is alert Skin: Skin is warm Psychiatric: Patient has normal mood and affect    Ortho Exam: Ortho exam demonstrates full range of motion of the right left elbow.  Does have proximal migration of the biceps itself in the right arm.  Supination strength is fairly symmetric to manual muscle testing.  Bicep strength also symmetric.  Patient does have well muscled arm.  EPL FPL interosseous function intact on the right-hand side with palpable radial pulse.  Specialty Comments:  No specialty comments available.  Imaging: No results found.   PMFS History: Patient Active Problem List   Diagnosis Date Noted  . Prediabetes 09/19/2020  . Biceps tendon tear 09/18/2020  . Bipolar 1 disorder, mixed (HCC) 09/18/2020  . Encounter for health-related screening 09/18/2020  . Skin rash 09/18/2020  . Tobacco use 09/18/2020  . Hypothyroidism 05/19/2019  . Recurrent syncope 04/24/2019   Past Medical History:  Diagnosis Date  . Bipolar 1 disorder, mixed (HCC) 2018   Managed with Zyprexa/Cogentin and Effexor.  Marland Kitchen Hypothyroidism (acquired)  On Synthroid  . Recurrent syncope    2 episodes before 2020, 2 episodes in 2020    Family History  Problem Relation Age of Onset  . Diabetes Mother     History reviewed. No pertinent surgical history. Social History   Occupational History  . Not on file  Tobacco Use  . Smoking status: Current Every Day Smoker    Packs/day: 0.30    Years: 10.00    Pack years: 3.00    Types: Cigarettes  . Smokeless tobacco: Never Used  Vaping Use  . Vaping Use: Never used  Substance and Sexual  Activity  . Alcohol use: No  . Drug use: Yes    Types: Marijuana  . Sexual activity: Not on file

## 2020-10-09 DIAGNOSIS — H43393 Other vitreous opacities, bilateral: Secondary | ICD-10-CM | POA: Diagnosis not present

## 2020-10-13 ENCOUNTER — Ambulatory Visit
Admission: RE | Admit: 2020-10-13 | Discharge: 2020-10-13 | Disposition: A | Discharge status: Home / Self Care | Payer: Medicaid Other | Source: Ambulatory Visit | Attending: Orthopedic Surgery | Admitting: Orthopedic Surgery

## 2020-10-13 ENCOUNTER — Other Ambulatory Visit: Payer: Self-pay

## 2020-10-13 DIAGNOSIS — S46211A Strain of muscle, fascia and tendon of other parts of biceps, right arm, initial encounter: Secondary | ICD-10-CM

## 2020-10-13 DIAGNOSIS — R600 Localized edema: Secondary | ICD-10-CM | POA: Diagnosis not present

## 2020-10-13 DIAGNOSIS — M79601 Pain in right arm: Secondary | ICD-10-CM | POA: Diagnosis not present

## 2020-10-14 ENCOUNTER — Other Ambulatory Visit: Payer: Self-pay

## 2020-10-14 DIAGNOSIS — S46211A Strain of muscle, fascia and tendon of other parts of biceps, right arm, initial encounter: Secondary | ICD-10-CM

## 2020-10-14 NOTE — Progress Notes (Signed)
The elbow scan is to evaluate chronic distal biceps rupture thanks

## 2020-10-14 NOTE — Progress Notes (Signed)
Need elbow scan thx

## 2020-10-27 ENCOUNTER — Other Ambulatory Visit: Payer: Self-pay

## 2020-10-27 ENCOUNTER — Ambulatory Visit
Admission: RE | Admit: 2020-10-27 | Discharge: 2020-10-27 | Disposition: A | Discharge status: Home / Self Care | Payer: Medicaid Other | Source: Ambulatory Visit | Attending: Orthopedic Surgery | Admitting: Orthopedic Surgery

## 2020-10-27 DIAGNOSIS — S46211A Strain of muscle, fascia and tendon of other parts of biceps, right arm, initial encounter: Secondary | ICD-10-CM

## 2020-10-27 DIAGNOSIS — M25521 Pain in right elbow: Secondary | ICD-10-CM | POA: Diagnosis not present

## 2020-11-07 ENCOUNTER — Encounter: Payer: Self-pay | Admitting: Orthopedic Surgery

## 2020-11-07 ENCOUNTER — Ambulatory Visit (INDEPENDENT_AMBULATORY_CARE_PROVIDER_SITE_OTHER): Payer: Medicaid Other | Admitting: Orthopedic Surgery

## 2020-11-07 DIAGNOSIS — S46211A Strain of muscle, fascia and tendon of other parts of biceps, right arm, initial encounter: Secondary | ICD-10-CM

## 2020-11-07 NOTE — Progress Notes (Signed)
Office Visit Note   Patient: Brian Weber           Date of Birth: 04/08/1966           MRN: [id number] Visit Date: 11/07/2020 Requested by: Storm Frisk, MD [address] [place],  [place] [address] PCP: Storm Frisk, MD  Subjective: Chief Complaint  Patient presents with  . Other    Scan review    HPI: Brian Weber is a 55 year old patient with chronic right biceps tendon rupture.  He returns for an MRI scan of the right elbow.  The biceps tendon has 9 cm of retraction.  describes pain as a primary component of his issues as opposed to weakness.  He is currently not working.  He does .  He is on Tree surgeon.              ROS: All systems reviewed are negative as they relate to the chief complaint within the history of present illness.  Patient denies  fevers or chills.   Assessment & Plan: Visit Diagnoses:  1. Biceps tendon rupture, right, initial encounter     Plan: Impression is chronic right elbow biceps tendon rupture with pain as a primary complaint over weakness and loss of function.  He is got good range of motion and pretty reasonable strength.  That tendon is retracted 9 cm which would mean primary repair would be possible.  He would have to have some type of allograft composite which would likely give him marginal pain relief.  Also marginal strength gains at this time.  Would also be a risky procedure in terms of nerve damage and heterotopic ossification.  Discussed all this with Nino in terms of the difficulty to predict the amount of pain relief he would have if any from that operation versus the risk associated with this surgery.  He wants to continue with what he has for now.  We will come back as needed.  Follow-Up Instructions: Return if symptoms worsen or fail to improve.   Orders:  No orders of the defined types were placed in this encounter.  No orders of the defined types were placed in this encounter.     Procedures: No procedures  performed   Clinical Data: No additional findings.  Objective: Vital Signs: There were no vitals taken for this visit.  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  He has weaker supination strength on the right compared to the left at 4-5 compared to 5+ out of 5.  Elbow flexion strength is only about 10% difference right versus left.  Elbow range of motion is full with full extension and full flexion.  Motor sensory function hands intact.  Radial pulses intact.  Specialty Comments:  No specialty comments available.  Imaging: No results found.   PMFS History: Patient Active Problem List   Diagnosis Date Noted  . Prediabetes 09/19/2020  . Biceps tendon tear 09/18/2020  . Bipolar 1 disorder, mixed (HCC) 09/18/2020  . Encounter for health-related screening 09/18/2020  . Skin rash 09/18/2020  . Tobacco use 09/18/2020  . Hypothyroidism 05/19/2019  . Recurrent syncope 04/24/2019   Past Medical History:  Diagnosis Date  . Bipolar 1 disorder, mixed (  HCC) 2018   Managed with Zyprexa/Cogentin and Effexor.  Marland Kitchen Hypothyroidism (acquired)    On Synthroid  . Recurrent syncope    2 episodes before 2020, 2 episodes in 2020    Family History  Problem Relation Age of Onset  . Diabetes Mother     History reviewed. No pertinent surgical history. Social History   Occupational History  . Not on file  Tobacco Use  . Smoking status: Current Every Day Smoker    Packs/day: 0.30    Years: 10.00    Pack years: 3.00    Types: Cigarettes  . Smokeless tobacco: Never Used  Vaping Use  . Vaping Use: Never used  Substance and Sexual Activity  . Alcohol  use: No  . Drug use: Yes    Types: Marijuana  . Sexual activity: Not on file

## 2021-02-06 ENCOUNTER — Encounter: Payer: Self-pay | Admitting: Critical Care Medicine

## 2021-02-06 ENCOUNTER — Ambulatory Visit: Payer: Medicaid Other | Attending: Critical Care Medicine | Admitting: Critical Care Medicine

## 2021-02-06 ENCOUNTER — Other Ambulatory Visit: Payer: Self-pay

## 2021-02-06 VITALS — BP 146/89 | HR 81 | Resp 16 | Wt 176.2 lb

## 2021-02-06 DIAGNOSIS — Z79890 Hormone replacement therapy (postmenopausal): Secondary | ICD-10-CM | POA: Insufficient documentation

## 2021-02-06 DIAGNOSIS — I10 Essential (primary) hypertension: Secondary | ICD-10-CM | POA: Insufficient documentation

## 2021-02-06 DIAGNOSIS — F4310 Post-traumatic stress disorder, unspecified: Secondary | ICD-10-CM | POA: Diagnosis not present

## 2021-02-06 DIAGNOSIS — S46219A Strain of muscle, fascia and tendon of other parts of biceps, unspecified arm, initial encounter: Secondary | ICD-10-CM

## 2021-02-06 DIAGNOSIS — F319 Bipolar disorder, unspecified: Secondary | ICD-10-CM | POA: Diagnosis not present

## 2021-02-06 DIAGNOSIS — Z833 Family history of diabetes mellitus: Secondary | ICD-10-CM | POA: Insufficient documentation

## 2021-02-06 DIAGNOSIS — F17210 Nicotine dependence, cigarettes, uncomplicated: Secondary | ICD-10-CM | POA: Diagnosis not present

## 2021-02-06 DIAGNOSIS — R7303 Prediabetes: Secondary | ICD-10-CM | POA: Diagnosis not present

## 2021-02-06 DIAGNOSIS — R030 Elevated blood-pressure reading, without diagnosis of hypertension: Secondary | ICD-10-CM

## 2021-02-06 DIAGNOSIS — E039 Hypothyroidism, unspecified: Secondary | ICD-10-CM | POA: Insufficient documentation

## 2021-02-06 DIAGNOSIS — H259 Unspecified age-related cataract: Secondary | ICD-10-CM | POA: Diagnosis not present

## 2021-02-06 DIAGNOSIS — F3160 Bipolar disorder, current episode mixed, unspecified: Secondary | ICD-10-CM | POA: Diagnosis not present

## 2021-02-06 DIAGNOSIS — Z79899 Other long term (current) drug therapy: Secondary | ICD-10-CM | POA: Diagnosis not present

## 2021-02-06 DIAGNOSIS — Z720 Tobacco use: Secondary | ICD-10-CM

## 2021-02-06 DIAGNOSIS — F1290 Cannabis use, unspecified, uncomplicated: Secondary | ICD-10-CM | POA: Insufficient documentation

## 2021-02-06 MED ORDER — LEVOTHYROXINE SODIUM 75 MCG PO TABS: 75.0000 ug | ORAL_TABLET | Freq: Every day | ORAL | 3 refills | Status: DC

## 2021-02-06 MED ORDER — PRAZOSIN HCL 1 MG PO CAPS: 1.0000 mg | ORAL_CAPSULE | Freq: Every day | ORAL | 2 refills | Status: DC

## 2021-02-06 MED ORDER — OLANZAPINE 7.5 MG PO TABS: 7.5000 mg | ORAL_TABLET | Freq: Every day | ORAL | 2 refills | Status: DC

## 2021-02-06 MED ORDER — BENZTROPINE MESYLATE 1 MG PO TABS: 1.0000 mg | ORAL_TABLET | Freq: Every day | ORAL | 1 refills | Status: DC

## 2021-02-06 MED ORDER — VENLAFAXINE HCL ER 75 MG PO CP24: 225.0000 mg | ORAL_CAPSULE | Freq: Every day | ORAL | 2 refills | Status: DC

## 2021-02-06 NOTE — Assessment & Plan Note (Signed)
Bipolar disorder follows by Vesta Mixer we will refill mental health medication  Significant nightmares will begin prazosin 1 mg at bedtime
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
## 2021-02-06 NOTE — Assessment & Plan Note (Signed)
Hypothyroidism stable at this time continue Synthroid 

## 2021-02-06 NOTE — Assessment & Plan Note (Signed)
Patient wishes to have the right cataract removal referral made back to his ophthalmologist

## 2021-02-06 NOTE — Assessment & Plan Note (Signed)
  .   Current smoking consumption amount: 1 pack every 3 days  . Dicsussion on advise to quit smoking and smoking impacts: Cardiovascular impacts  . Patient's willingness to quit: Willing to quit  . Methods to quit smoking discussed: Behavioral modification and nicotine lozenge  . Medication management of smoking session drugs discussed: Nicotine replacement  . Resources provided:  AVS   . Setting quit date not established  . Follow-up arranged 2 months  Time spent counseling the patient: 5 minutes  

## 2021-02-06 NOTE — Assessment & Plan Note (Signed)
We will continue to monitor recommend DASH diet and begin Minipress 1 mg daily

## 2021-02-06 NOTE — Patient Instructions (Signed)
Obtain an elbow brace for your right arm to use during the day  Focus on low-salt diet see attachment as you do have elevated blood pressure readings  Begin Minipress/prazosin 1 tablet daily take an hour before falling asleep this will help with nightmares and will also lower blood pressure  Refills on all the medications sent to your [facility] pharmacy  Referral to your eye physician was made they will contact you  Focus on reducing tobacco intake as we have previously discussed it is running your blood pressure up  Remember the techniques we talked about to help you fall asleep easier at night including not watching any hard news before falling asleep  Return to see Dr. Delford Field in 4 months

## 2021-02-06 NOTE — Assessment & Plan Note (Signed)
Patient has mild elevation in A1c recommended healthy diet

## 2021-02-06 NOTE — Assessment & Plan Note (Signed)
Patient declined to have surgical procedure recommended elbow brace

## 2021-02-10 ENCOUNTER — Ambulatory Visit: Payer: Medicaid Other | Admitting: Critical Care Medicine

## 2021-02-17 DIAGNOSIS — H2511 Age-related nuclear cataract, right eye: Secondary | ICD-10-CM | POA: Diagnosis not present

## 2021-03-20 DIAGNOSIS — H25041 Posterior subcapsular polar age-related cataract, right eye: Secondary | ICD-10-CM | POA: Diagnosis not present

## 2021-03-20 DIAGNOSIS — H2511 Age-related nuclear cataract, right eye: Secondary | ICD-10-CM | POA: Diagnosis not present

## 2021-03-20 DIAGNOSIS — H25011 Cortical age-related cataract, right eye: Secondary | ICD-10-CM | POA: Diagnosis not present

## 2021-04-01 DIAGNOSIS — H04123 Dry eye syndrome of bilateral lacrimal glands: Secondary | ICD-10-CM | POA: Diagnosis not present

## 2021-04-14 DIAGNOSIS — H43393 Other vitreous opacities, bilateral: Secondary | ICD-10-CM | POA: Diagnosis not present

## 2021-04-14 DIAGNOSIS — H5213 Myopia, bilateral: Secondary | ICD-10-CM | POA: Diagnosis not present

## 2021-05-08 DIAGNOSIS — Z419 Encounter for procedure for purposes other than remedying health state, unspecified: Secondary | ICD-10-CM | POA: Diagnosis not present

## 2021-06-08 ENCOUNTER — Encounter (HOSPITAL_COMMUNITY): Payer: Self-pay | Admitting: Emergency Medicine

## 2021-06-08 ENCOUNTER — Emergency Department (HOSPITAL_COMMUNITY): Payer: Medicaid Other

## 2021-06-08 ENCOUNTER — Other Ambulatory Visit: Payer: Self-pay

## 2021-06-08 ENCOUNTER — Emergency Department (HOSPITAL_COMMUNITY)
Admission: EM | Admit: 2021-06-08 | Discharge: 2021-06-08 | Disposition: A | Discharge status: Home / Self Care | Payer: Medicaid Other | Attending: Emergency Medicine | Admitting: Emergency Medicine

## 2021-06-08 DIAGNOSIS — M7989 Other specified soft tissue disorders: Secondary | ICD-10-CM | POA: Diagnosis not present

## 2021-06-08 DIAGNOSIS — K122 Cellulitis and abscess of mouth: Secondary | ICD-10-CM | POA: Diagnosis not present

## 2021-06-08 DIAGNOSIS — K0889 Other specified disorders of teeth and supporting structures: Secondary | ICD-10-CM | POA: Diagnosis not present

## 2021-06-08 DIAGNOSIS — Z9889 Other specified postprocedural states: Secondary | ICD-10-CM

## 2021-06-08 DIAGNOSIS — J3489 Other specified disorders of nose and nasal sinuses: Secondary | ICD-10-CM | POA: Diagnosis not present

## 2021-06-08 DIAGNOSIS — Z98818 Other dental procedure status: Secondary | ICD-10-CM | POA: Insufficient documentation

## 2021-06-08 DIAGNOSIS — Z419 Encounter for procedure for purposes other than remedying health state, unspecified: Secondary | ICD-10-CM | POA: Diagnosis not present

## 2021-06-08 DIAGNOSIS — Z872 Personal history of diseases of the skin and subcutaneous tissue: Secondary | ICD-10-CM | POA: Diagnosis not present

## 2021-06-08 LAB — CBC WITH DIFFERENTIAL/PLATELET
Abs Immature Granulocytes: 0.01 10*3/uL (ref 0.00–0.07)
Basophils Absolute: 0 10*3/uL (ref 0.0–0.1)
Basophils Relative: 0 %
Eosinophils Absolute: 0.1 10*3/uL (ref 0.0–0.5)
Eosinophils Relative: 1 %
HCT: 35.1 % — ABNORMAL LOW (ref 39.0–52.0)
Hemoglobin: 11.3 g/dL — ABNORMAL LOW (ref 13.0–17.0)
Immature Granulocytes: 0 %
Lymphocytes Relative: 26 %
Lymphs Abs: 1.5 10*3/uL (ref 0.7–4.0)
MCH: 28.3 pg (ref 26.0–34.0)
MCHC: 32.2 g/dL (ref 30.0–36.0)
MCV: 87.8 fL (ref 80.0–100.0)
Monocytes Absolute: 0.4 10*3/uL (ref 0.1–1.0)
Monocytes Relative: 7 %
Neutro Abs: 3.8 10*3/uL (ref 1.7–7.7)
Neutrophils Relative %: 66 %
Platelets: 224 10*3/uL (ref 150–400)
RBC: 4 MIL/uL — ABNORMAL LOW (ref 4.22–5.81)
RDW: 14.6 % (ref 11.5–15.5)
WBC: 5.9 10*3/uL (ref 4.0–10.5)
nRBC: 0 % (ref 0.0–0.2)

## 2021-06-08 LAB — BASIC METABOLIC PANEL
Anion gap: 9 (ref 5–15)
BUN: 7 mg/dL (ref 6–20)
CO2: 24 mmol/L (ref 22–32)
Calcium: 9.1 mg/dL (ref 8.9–10.3)
Chloride: 98 mmol/L (ref 98–111)
Creatinine, Ser: 1.26 mg/dL — ABNORMAL HIGH (ref 0.61–1.24)
GFR, Estimated: >60 mL/min (ref >60)
Glucose, Bld: 106 mg/dL — ABNORMAL HIGH (ref 70–99)
Potassium: 3.9 mmol/L (ref 3.5–5.1)
Sodium: 131 mmol/L — ABNORMAL LOW (ref 135–145)

## 2021-06-08 MED ORDER — DEXAMETHASONE SODIUM PHOSPHATE 10 MG/ML IJ SOLN
10.0000 mg | Freq: Once | INTRAMUSCULAR | Status: AC
  Administered 2021-06-08: 10 mg via INTRAVENOUS
  Filled 2021-06-08: qty 1

## 2021-06-08 MED ORDER — PREDNISONE 20 MG PO TABS: 40.0000 mg | ORAL_TABLET | Freq: Every day | ORAL | 0 refills | Status: AC

## 2021-06-08 MED ORDER — IOHEXOL 300 MG/ML  SOLN
100.0000 mL | Freq: Once | INTRAMUSCULAR | Status: AC | PRN
  Administered 2021-06-08: 100 mL via INTRAVENOUS

## 2021-06-08 NOTE — ED Provider Notes (Signed)
Emergency Medicine Provider Triage Evaluation Note  Brian Weber , a 56 y.o. male  was evaluated in triage.  Pt complains of dental pain.  Patient had multiple dental extractions about 2 weeks ago on his right lower jaw.  He reports he was not placed on any antibiotics after the extraction.  Over the past week he has had worsening pain in the right lower jaw and now can barely open his mouth enough to eat a slice of bread.  He reports pain under the right side of his chin and under his tongue.  He has had subjective fevers, no vomiting.  Able to swallow secretions.  Review of Systems  Positive: Dental pain, chills Negative: Vomiting, difficulty breathing or swallowing  Physical Exam  BP (!) 155/87 (BP Location: Right Arm)    Pulse (!) 58    Temp 97.9 F (36.6 C) (Oral)    Resp 16    SpO2 100%  Gen:   Awake, no distress   Resp:  Normal effort  MSK:   Moves extremities without difficulty  Other:  Multiple extracted teeth noted, patient with significant tenderness under the right side of the tongue, and trismus  Medical Decision Making  Medically screening exam initiated at 10:18 AM.  Appropriate orders placed.  Erek Kowal was informed that the remainder of the evaluation will be completed by another provider, this initial triage assessment does not replace that evaluation, and the importance of remaining in the ED until their evaluation is complete.  Labs and CT ordered to rule out Ludewig's angina given sublingual tenderness and trismus on exam   Legrand Rams 06/08/21 1021    Cathren Laine, MD 06/09/21 [phone]

## 2021-06-08 NOTE — ED Triage Notes (Signed)
Pt reports he had 2 R lower teeth extracted 2 weeks ago.  Reports R lower dental pain at extraction site, R sided neck pain, and tenderness under tongue. States he is feeling feverish and having chills.

## 2021-06-08 NOTE — Discharge Instructions (Signed)
Continue taking home medications as prescribed. Take the prednisone for the next 4 days, starting tomorrow (you already received this kind of medicine in the ER today) Follow-up with your dentist for further evaluation. Return to the emergency room if develop fevers, increased pain, increased difficulty opening her mouth or swallowing, pus draining from the area, or any new, worsening, or concerning symptoms

## 2021-06-08 NOTE — ED Notes (Signed)
Pt asking for food 

## 2021-06-08 NOTE — ED Provider Notes (Signed)
[facility] EMERGENCY DEPARTMENT Provider Note   CSN: [id number] Arrival date & time: 06/08/21  1008     History  Chief Complaint  Patient presents with   Dental Pain    Brian Weber is a 56 y.o. male presenting for evaluation of dental pain and difficulty opening his mouth.  Patient states about 2 weeks ago he had a single tooth dental infection.  A few days later, he had increased pain, went back and had another tooth pulled.  He states he was doing well until a few days ago, when he had increased pain, swelling, difficulty opening his mouth.  He is not on anything for pain or inflammation.  Not on any antibiotics.  He denies fevers.  No difficulty swallowing.  No drainage.  HPI     Home Medications Prior to Admission medications   Medication Sig Start Date End Date Taking? Authorizing Provider  predniSONE (DELTASONE) 20 MG tablet Take 2 tablets (40 mg total) by mouth daily for 4 days. 06/08/21 06/12/21 Yes Shawniece Oyola, PA-C  benztropine (COGENTIN) 1 MG tablet Take 1 tablet (1 mg total) by mouth at bedtime. 02/06/21   Storm Frisk, MD  levothyroxine (SYNTHROID) 75 MCG tablet Take 1 tablet (75 mcg total) by mouth daily before breakfast. 02/06/21   Storm Frisk, MD  OLANZapine (ZYPREXA) 7.5 MG tablet Take 1 tablet (7.5 mg total) by mouth at bedtime. 02/06/21   Storm Frisk, MD  prazosin (MINIPRESS) 1 MG capsule Take 1 capsule (1 mg total) by mouth at bedtime. 02/06/21   Storm Frisk, MD  venlafaxine XR (EFFEXOR-XR) 75 MG 24 hr capsule Take 3 capsules (225 mg total) by mouth at bedtime. 02/06/21   Storm Frisk, MD      Allergies    Patient has no known allergies.    Review of Systems   Review of Systems  HENT:  Positive for dental problem.   All other systems reviewed and are negative.  Physical Exam Updated Vital Signs BP 132/85    Pulse (!) 54    Temp 98.2 F (36.8 C)    Resp 16    SpO2 100%  Physical Exam Vitals and nursing note  reviewed.  Constitutional:      General: He is not in acute distress.    Appearance: Normal appearance.     Comments: Resting in the bed in NAD  HENT:     Head: Normocephalic and atraumatic.     Mouth/Throat:     Comments: Mild trismus.  No significant gum edema, no drainage.  Tenderness palpation under the tongue.  No swelling extending into the neck.  No difficulty swallowing.  Handling secretions easily. No muffled voice.  Eyes:     Extraocular Movements: Extraocular movements intact.     Conjunctiva/sclera: Conjunctivae normal.     Pupils: Pupils are equal, round, and reactive to light.  Cardiovascular:     Rate and Rhythm: Normal rate and regular rhythm.     Pulses: Normal pulses.  Pulmonary:     Effort: Pulmonary effort is normal. No respiratory distress.     Breath sounds: Normal breath sounds. No wheezing.     Comments: Speaking in full sentences.  Clear lung sounds in all fields. Abdominal:     General: There is no distension.     Palpations: Abdomen is soft. There is no mass.     Tenderness: There is no abdominal tenderness. There is no guarding or rebound.  Musculoskeletal:  General: Normal range of motion.     Cervical back: Normal range of motion and neck supple.  Skin:    General: Skin is warm and dry.     Capillary Refill: Capillary refill takes less than 2 seconds.  Neurological:     Mental Status: He is alert and oriented to person, place, and time.  Psychiatric:        Mood and Affect: Mood and affect normal.        Speech: Speech normal.        Behavior: Behavior normal.    ED Results / Procedures / Treatments   Labs (all labs ordered are listed, but only abnormal results are displayed) Labs Reviewed  BASIC METABOLIC PANEL - Abnormal; Notable for the following components:      Result Value   Sodium 131 (*)    Glucose, Bld 106 (*)    Creatinine, Ser 1.26 (*)    All other components within normal limits  CBC WITH DIFFERENTIAL/PLATELET - Abnormal;  Notable for the following components:   RBC 4.00 (*)    Hemoglobin 11.3 (*)    HCT 35.1 (*)    All other components within normal limits    EKG None  Radiology CT Maxillofacial W Contrast  Result Date: 06/08/2021 CLINICAL DATA:  Maxillary/facial abscess. Recent dental extractions on the right lower molars, pain under the tongue and trismus, rule out Ludwig's angina. EXAM: CT MAXILLOFACIAL WITH CONTRAST TECHNIQUE: Multidetector CT imaging of the maxillofacial structures was performed with intravenous contrast. Multiplanar CT image reconstructions were also generated. CONTRAST:  OMNIPAQUE IOHEXOL 300 MG/ML  SOLN COMPARISON:  Maxillofacial CT 02/13/2019 FINDINGS: Osseous: No acute fracture or suspicious osseous lesion. Periapical lucencies involving right maxillary and right mandibular molars, right maxillary canine, and bilateral maxillary premolars. Evidence of recent extraction of both right-sided mandibular premolars and likely bilateral mandibular lateral incisors. At most mild regional soft tissue swelling without evidence of an abscess, an no substantial edema in the floor of mouth to suggest Ludwig's angina. Orbits: Bilateral cataract extraction. Sinuses: Minimal mucosal thickening in the paranasal sinuses. Clear mastoid air cells. Soft tissues: No additional findings. Limited intracranial: Unremarkable. IMPRESSION: Recent mandibular dental extractions with at most mild regional soft tissue edema. No abscess. Electronically Signed   By: Sebastian Ache M.D.   On: 06/08/2021 15:32    Procedures Procedures    Medications Ordered in ED Medications  dexamethasone (DECADRON) injection 10 mg (10 mg Intravenous Given 06/08/21 1427)  iohexol (OMNIPAQUE) 300 MG/ML solution 100 mL (100 mLs Intravenous Contrast Given 06/08/21 1508)    ED Course/ Medical Decision Making/ A&P                           Medical Decision Making   This patient presents to the ED for concern of dental pain s/p  extraction. This involves a number of treatment options, and is a complaint that carries with it a moderate risk of complications and morbidity.  The differential diagnosis includes infection, inflammation, postop injury  Lab Tests:  Labs obtained from triage independently interpreted by me, overall reassuring.  No leukocytosis.   Imaging Studies:  imaging studies ordered includes CT maxilofacial I independently visualized and interpreted imaging which showed inflammation, no infection I agree with the radiologist interpretation   Medicines ordered:  I ordered medication including Decadron for inflammation Reevaluation of the patient after these medicines showed that the patient improved   Dispostion:  After consideration  of the diagnostic results and the patients response to treatment, I feel that the patent would benefit from outpatient management with anti-inflammatory.  A burst of prednisone given.  Encourage follow-up with outpatient dental management.  At this time, patient appears safe for discharge. return precautions given.  Patient states he understands and agrees plan  Final Clinical Impression(s) / ED Diagnoses Final diagnoses:  Pain, dental  History of recent dental procedure    Rx / DC Orders ED Discharge Orders          Ordered    predniSONE (DELTASONE) 20 MG tablet  Daily        06/08/21 1547              Jahari Wiginton, PA-C 06/08/21 1551    Cathren LaineSteinl, Kevin, MD 06/09/21 [phone]

## 2021-06-08 NOTE — ED Notes (Signed)
Patient transported to CT 

## 2021-06-11 ENCOUNTER — Encounter: Payer: Self-pay | Admitting: Critical Care Medicine

## 2021-06-11 ENCOUNTER — Ambulatory Visit: Payer: Medicaid Other | Attending: Critical Care Medicine | Admitting: Critical Care Medicine

## 2021-06-11 ENCOUNTER — Other Ambulatory Visit: Payer: Self-pay

## 2021-06-11 VITALS — BP 150/85 | HR 87 | Ht 66.0 in | Wt 181.0 lb

## 2021-06-11 DIAGNOSIS — Z720 Tobacco use: Secondary | ICD-10-CM

## 2021-06-11 DIAGNOSIS — Z1211 Encounter for screening for malignant neoplasm of colon: Secondary | ICD-10-CM

## 2021-06-11 DIAGNOSIS — E039 Hypothyroidism, unspecified: Secondary | ICD-10-CM

## 2021-06-11 DIAGNOSIS — R030 Elevated blood-pressure reading, without diagnosis of hypertension: Secondary | ICD-10-CM | POA: Diagnosis not present

## 2021-06-11 DIAGNOSIS — F17210 Nicotine dependence, cigarettes, uncomplicated: Secondary | ICD-10-CM | POA: Diagnosis not present

## 2021-06-11 DIAGNOSIS — R7303 Prediabetes: Secondary | ICD-10-CM

## 2021-06-11 DIAGNOSIS — H259 Unspecified age-related cataract: Secondary | ICD-10-CM | POA: Diagnosis not present

## 2021-06-11 DIAGNOSIS — S46219A Strain of muscle, fascia and tendon of other parts of biceps, unspecified arm, initial encounter: Secondary | ICD-10-CM

## 2021-06-11 MED ORDER — BENZTROPINE MESYLATE 1 MG PO TABS: 1.0000 mg | ORAL_TABLET | Freq: Every day | ORAL | 1 refills | Status: DC

## 2021-06-11 MED ORDER — PRAZOSIN HCL 1 MG PO CAPS: 1.0000 mg | ORAL_CAPSULE | Freq: Every day | ORAL | 2 refills | Status: DC

## 2021-06-11 MED ORDER — OLANZAPINE 7.5 MG PO TABS: 7.5000 mg | ORAL_TABLET | Freq: Every day | ORAL | 2 refills | Status: DC

## 2021-06-11 MED ORDER — CYCLOBENZAPRINE HCL 10 MG PO TABS: 10.0000 mg | ORAL_TABLET | Freq: Three times a day (TID) | ORAL | 1 refills | Status: DC | PRN

## 2021-06-11 MED ORDER — VENLAFAXINE HCL ER 75 MG PO CP24: 225.0000 mg | ORAL_CAPSULE | Freq: Every day | ORAL | 2 refills | Status: DC

## 2021-06-11 MED ORDER — LEVOTHYROXINE SODIUM 75 MCG PO TABS: 75.0000 ug | ORAL_TABLET | Freq: Every day | ORAL | 3 refills | Status: DC

## 2021-06-11 NOTE — Progress Notes (Signed)
Pain in right arm. Dental pain.
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
## 2021-06-11 NOTE — Assessment & Plan Note (Signed)
° ° °•   Current smoking consumption amount: 1 pack every 3 days  . Dicsussion on advise to quit smoking and smoking impacts: Cardiovascular impacts  . Patient's willingness to quit: Willing to quit  . Methods to quit smoking discussed: Behavioral modification and nicotine lozenge  . Medication management of smoking session drugs discussed: Nicotine replacement  . Resources provided:  AVS   . Setting quit date not established  . Follow-up arranged 2 months  Time spent counseling the patient: 5 minutes  

## 2021-06-11 NOTE — Assessment & Plan Note (Signed)
Patient followed by eye clinician is on currently eyedrops

## 2021-06-11 NOTE — Assessment & Plan Note (Signed)
No indication for medication at this time.

## 2021-06-11 NOTE — Assessment & Plan Note (Signed)
Blood pressure remains elevated at this time although his last visit few days ago it was normal  I will bring him in for another blood pressure check and if remains high we will pursue medications  Patient counseled on smoking cessation

## 2021-06-11 NOTE — Assessment & Plan Note (Signed)
Not a surgical candidate will prescribe cyclobenzaprine 3 times daily as needed for muscle spasticity

## 2021-06-11 NOTE — Patient Instructions (Signed)
Refills on all your medicines sent to your [facility]  A muscle relaxant was sent to the pharmacy to take 3 times a day as needed for muscle spasticity  Finish her current course of prednisone  Call your dentist to have a sooner appointment  Use the pure diet or liquid diet until your jaw is improving as we discussed  Go to the [facility] open access day Monday through Friday 10:52 AM if you arrive at 7:45 AM or more likely be seen faster to get established with a new mental health provider see handout we gave you  Declined flu vaccine and pneumonia vaccine  Continue to work on your smoking cessation  Return to see Dr. Joya Gaskins 3 months

## 2021-06-11 NOTE — Assessment & Plan Note (Signed)
Stable current continue current Synthroid therapy

## 2021-06-26 ENCOUNTER — Other Ambulatory Visit: Payer: Self-pay

## 2021-06-26 ENCOUNTER — Ambulatory Visit: Payer: Medicaid Other | Attending: Critical Care Medicine

## 2021-06-26 VITALS — BP 115/79 | HR 90

## 2021-06-26 DIAGNOSIS — R030 Elevated blood-pressure reading, without diagnosis of hypertension: Secondary | ICD-10-CM | POA: Diagnosis not present

## 2021-06-26 NOTE — Progress Notes (Signed)
Patient ID: Brian Weber, male   DOB: 08/12/1965, 56 y.o.   MRN: [id number] BP normal  will monitor

## 2021-06-26 NOTE — Progress Notes (Signed)
Patient ID: Brian Weber, male   DOB: May 22, 1966, 56 y.o.   MRN: [id number] BP recheck reviewed . Patient is at goal  pt notified.

## 2021-07-09 DIAGNOSIS — Z419 Encounter for procedure for purposes other than remedying health state, unspecified: Secondary | ICD-10-CM | POA: Diagnosis not present

## 2021-08-06 DIAGNOSIS — Z419 Encounter for procedure for purposes other than remedying health state, unspecified: Secondary | ICD-10-CM | POA: Diagnosis not present

## 2021-09-06 DIAGNOSIS — Z419 Encounter for procedure for purposes other than remedying health state, unspecified: Secondary | ICD-10-CM | POA: Diagnosis not present
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
## 2021-09-22 ENCOUNTER — Other Ambulatory Visit: Payer: Self-pay

## 2021-09-22 ENCOUNTER — Encounter: Payer: Self-pay | Admitting: Critical Care Medicine

## 2021-09-22 ENCOUNTER — Ambulatory Visit: Payer: Medicaid Other | Attending: Critical Care Medicine | Admitting: Critical Care Medicine

## 2021-09-22 VITALS — BP 162/101 | HR 79 | Wt 176.2 lb

## 2021-09-22 DIAGNOSIS — F17210 Nicotine dependence, cigarettes, uncomplicated: Secondary | ICD-10-CM | POA: Diagnosis not present

## 2021-09-22 DIAGNOSIS — F3160 Bipolar disorder, current episode mixed, unspecified: Secondary | ICD-10-CM

## 2021-09-22 DIAGNOSIS — E039 Hypothyroidism, unspecified: Secondary | ICD-10-CM | POA: Diagnosis not present

## 2021-09-22 DIAGNOSIS — R7303 Prediabetes: Secondary | ICD-10-CM | POA: Diagnosis not present

## 2021-09-22 DIAGNOSIS — R1319 Other dysphagia: Secondary | ICD-10-CM | POA: Insufficient documentation

## 2021-09-22 DIAGNOSIS — F319 Bipolar disorder, unspecified: Secondary | ICD-10-CM | POA: Insufficient documentation

## 2021-09-22 DIAGNOSIS — Z720 Tobacco use: Secondary | ICD-10-CM | POA: Diagnosis not present

## 2021-09-22 DIAGNOSIS — I10 Essential (primary) hypertension: Secondary | ICD-10-CM | POA: Insufficient documentation

## 2021-09-22 DIAGNOSIS — Z79899 Other long term (current) drug therapy: Secondary | ICD-10-CM | POA: Diagnosis not present

## 2021-09-22 MED ORDER — NICOTINE 21 MG/24HR TD PT24: 21.0000 mg | MEDICATED_PATCH | Freq: Every day | TRANSDERMAL | 0 refills | Status: DC

## 2021-09-22 MED ORDER — AMLODIPINE BESYLATE 5 MG PO TABS: 5.0000 mg | ORAL_TABLET | Freq: Every day | ORAL | 2 refills | Status: DC

## 2021-09-22 MED ORDER — PANTOPRAZOLE SODIUM 40 MG PO TBEC: 40.0000 mg | DELAYED_RELEASE_TABLET | Freq: Every day | ORAL | 3 refills | Status: DC

## 2021-09-22 MED ORDER — PRAZOSIN HCL 1 MG PO CAPS: 1.0000 mg | ORAL_CAPSULE | Freq: Every day | ORAL | 2 refills | Status: DC

## 2021-09-22 MED ORDER — CYCLOBENZAPRINE HCL 10 MG PO TABS: 10.0000 mg | ORAL_TABLET | Freq: Three times a day (TID) | ORAL | 1 refills | Status: DC | PRN

## 2021-09-22 MED ORDER — OLANZAPINE 7.5 MG PO TABS: 7.5000 mg | ORAL_TABLET | Freq: Every day | ORAL | 2 refills | Status: DC

## 2021-09-22 MED ORDER — BENZTROPINE MESYLATE 1 MG PO TABS: 1.0000 mg | ORAL_TABLET | Freq: Every day | ORAL | 1 refills | Status: DC

## 2021-09-22 MED ORDER — LEVOTHYROXINE SODIUM 75 MCG PO TABS: 75.0000 ug | ORAL_TABLET | Freq: Every day | ORAL | 3 refills | Status: DC

## 2021-09-22 MED ORDER — VENLAFAXINE HCL ER 75 MG PO CP24: 225.0000 mg | ORAL_CAPSULE | Freq: Every day | ORAL | 2 refills | Status: DC

## 2021-09-22 NOTE — Assessment & Plan Note (Signed)
? ? ??   Current smoking consumption amount: 1 pack every 3 days  . Dicsussion on advise to quit smoking and smoking impacts: Cardiovascular impacts  . Patient's willingness to quit: Willing to quit  . Methods to quit smoking discussed: Behavioral modification and nicotine lozenge  . Medication management of smoking session drugs discussed: Nicotine replacement  . Resources provided:  AVS   . Setting quit date not established  . Follow-up arranged 2 months  Time spent counseling the patient: 5 minutes  

## 2021-09-22 NOTE — Assessment & Plan Note (Signed)
Continue healthy diet

## 2021-09-22 NOTE — Assessment & Plan Note (Signed)
Patient now officially diagnosed hypertension plan to begin amlodipine 5 mg daily and instructed him to you follow a low-salt diet return for short-term follow-up in 2 months ?

## 2021-09-22 NOTE — Patient Instructions (Signed)
Stop smoking use nicotine patch ? ?Begin amlodipine 5 mg daily for elevated blood pressure ? ?Stop eating breads for now and begin pantoprazole daily for your stomach ? ?Continue all other medications as prescribed ? ?[facility] on all [facility] ? ?Return in 6 weeks for recheck on blood pressure ? ?See attachment on smoking cessation ? ? ?

## 2021-09-22 NOTE — Assessment & Plan Note (Signed)
We will continue Synthroid at current dose and assess thyroid function ?

## 2021-09-23 ENCOUNTER — Other Ambulatory Visit: Payer: Self-pay | Admitting: Critical Care Medicine

## 2021-09-23 DIAGNOSIS — E039 Hypothyroidism, unspecified: Secondary | ICD-10-CM

## 2021-09-23 LAB — THYROID PANEL WITH TSH
Free Thyroxine Index: 1.2 (ref 1.2–4.9)
T3 Uptake Ratio: 25 % (ref 24–39)
T4, Total: 4.8 ug/dL (ref 4.5–12.0)
TSH: 18.3 u[IU]/mL — ABNORMAL HIGH (ref 0.450–4.500)

## 2021-09-23 MED ORDER — LEVOTHYROXINE SODIUM 100 MCG PO TABS: 100.0000 ug | ORAL_TABLET | Freq: Every day | ORAL | 2 refills | Status: DC

## 2021-09-24 ENCOUNTER — Telehealth: Payer: Self-pay

## 2021-09-24 NOTE — Telephone Encounter (Signed)
-----   Message from Storm Frisk, MD sent at 09/23/2021  6:01 AM EDT ----- ?Let the pt know his thyroid is not fully supported by current thyroid dose, I recommend he increase dose to daily,, I sent refill in at new dose  ? ?Recommend recheck thyroid in three weeks:   order placed future ?

## 2021-09-24 NOTE — Telephone Encounter (Signed)
Pt was called and vm was left, Information has been sent to nurse pool.   

## 2021-10-02 ENCOUNTER — Telehealth: Payer: Self-pay | Admitting: Pharmacist

## 2021-10-02 NOTE — Telephone Encounter (Signed)
Patient appearing on report for [facility] - Hypertension Control report due to last documented ambulatory blood pressure of 162/101 on 09/22/21. Next appointment with PCP is 11/19/21 ? ?Outreached patient to discuss hypertension control and medication management. Left voicemail for him to return my call at his convenience.  ? ?Catie Eppie Gibson, PharmD, BCACP ?[facility] ?[phone] ? ?

## 2021-10-05 IMAGING — MR MR ELBOW*R* W/O CM
4 of 5 series · 19 of 40 positions shown · non-contrast
Comparison: MRI right upper arm 10/13/2020.

CLINICAL DATA: Right elbow pain for 6 months since the patient had
pulling injury and felt a pop.

EXAM:
MRI OF THE RIGHT ELBOW WITHOUT CONTRAST
TECHNIQUE: Multiplanar, multisequence MR imaging of the elbow was performed. No
intravenous contrast was administered.

[Series 5: T2 fat-sat · axial · 3.0mm · 0.23mm/px · z∈[-33,+60]mm · 8 of 26 slices shown (1 of 3)]
[im 1/26]
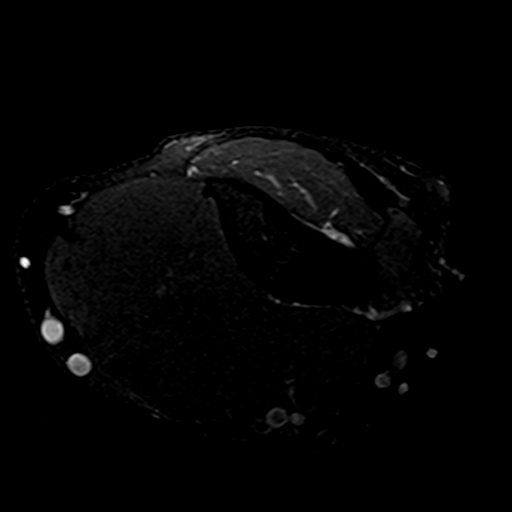
[im 3/26]
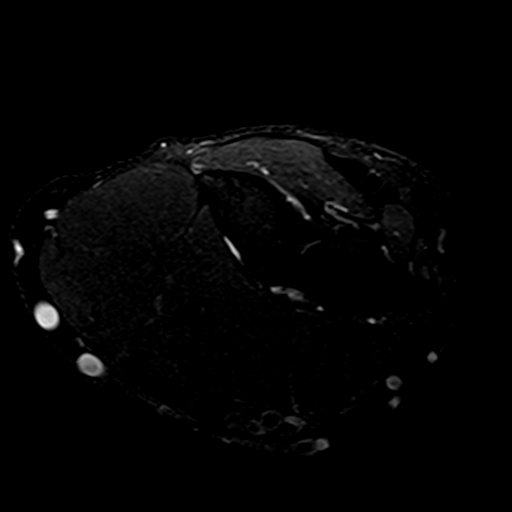
[im 9/26]
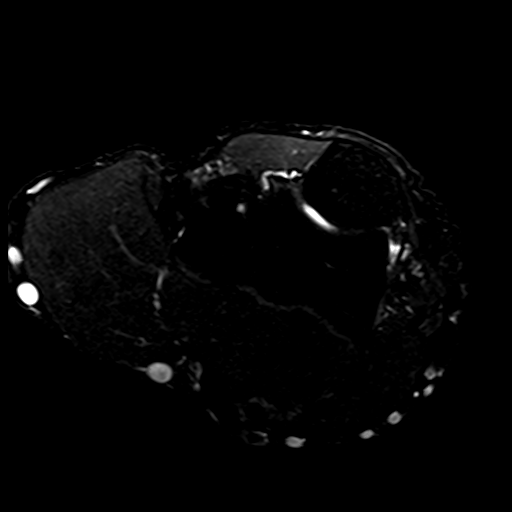
[im 12/26]
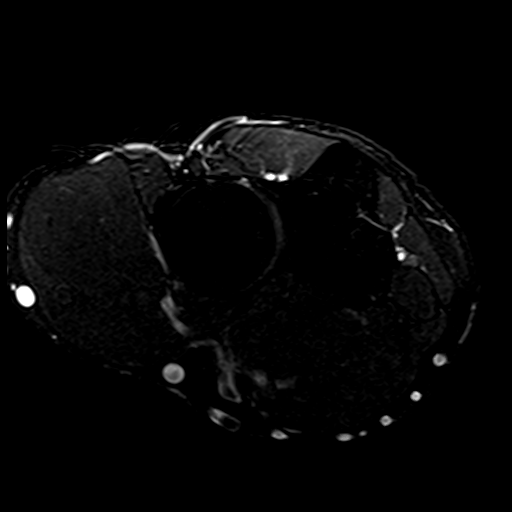
[im 14/26]
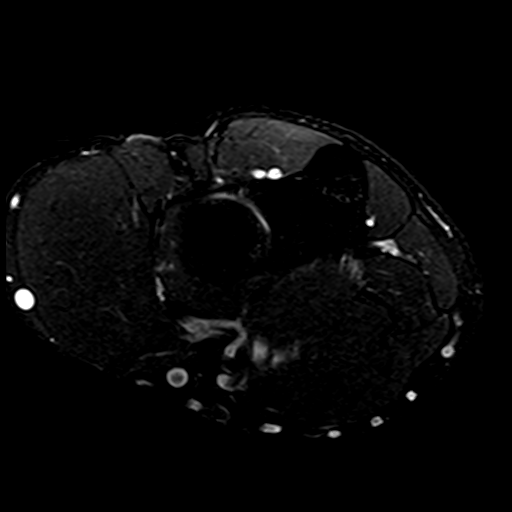
[im 17/26]
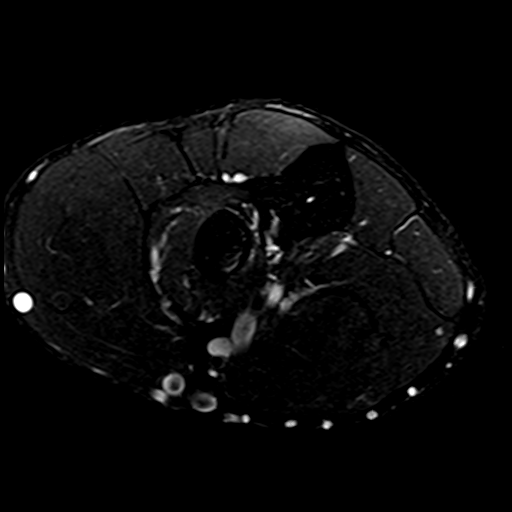
[im 23/26]
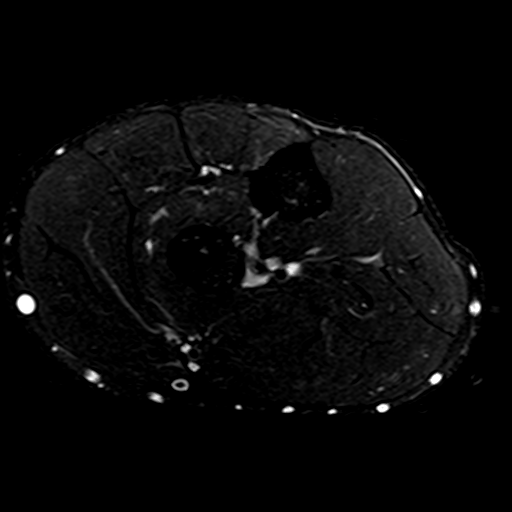
[im 26/26]
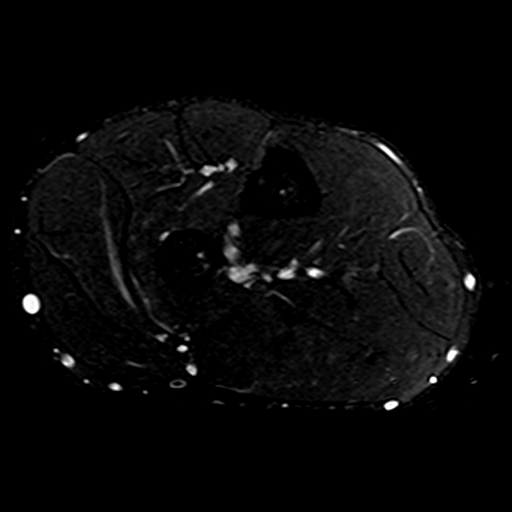

[Series 6: T1 · axial · 3.0mm · 0.23mm/px · z∈[-22,+45]mm · 3 of 26 slices shown]
[im 4/26]
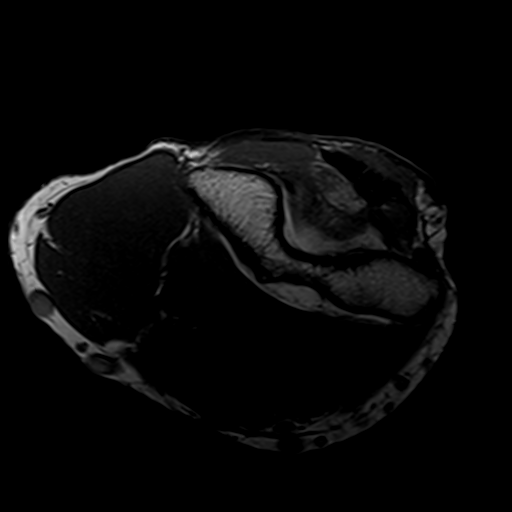
[im 13/26]
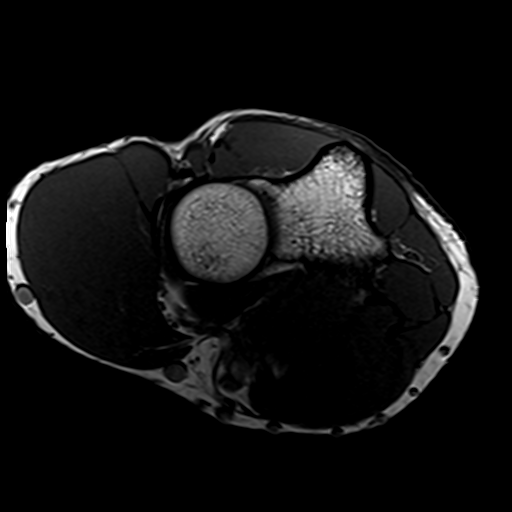
[im 22/26]
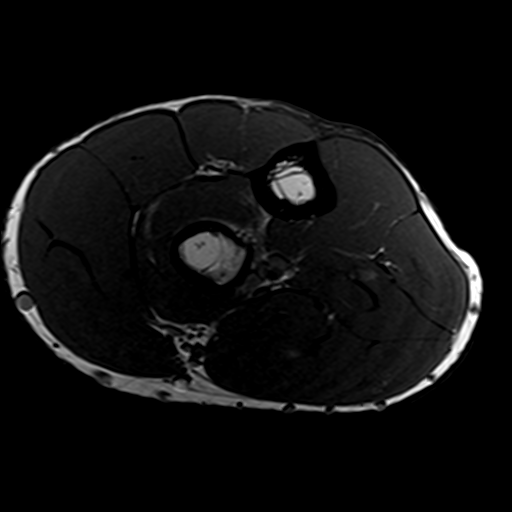

[Series 7: T2 fat-sat · coronal · 3.0mm · 0.27mm/px · 5 of 20 slices shown (2 of 3)]
[im 1/20]
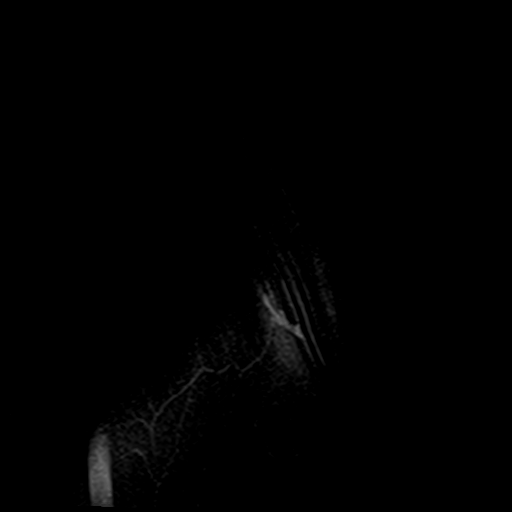
[im 4/20]
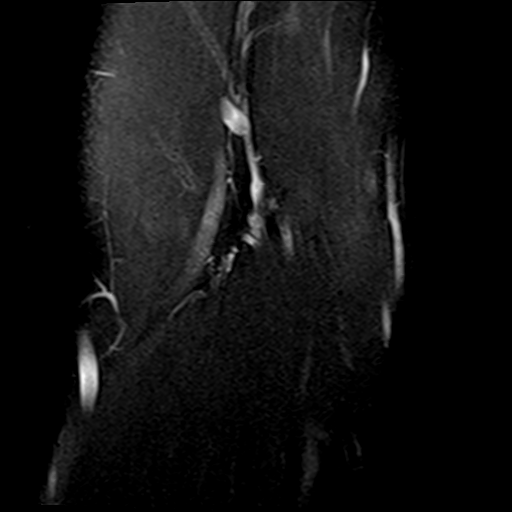
[im 7/20]
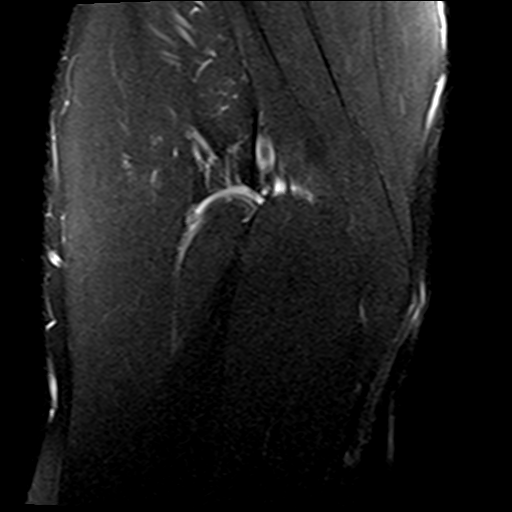
[im 10/20]
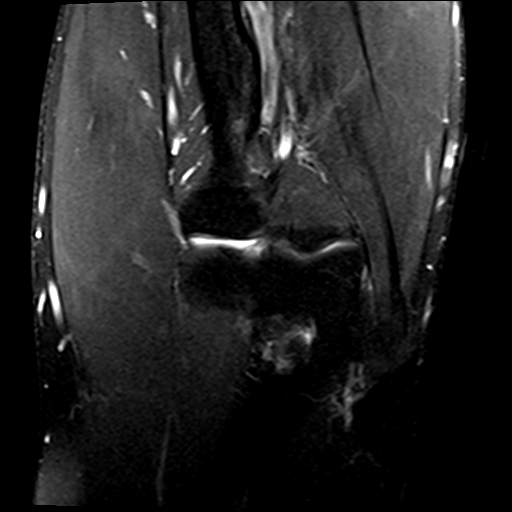
[im 16/20]
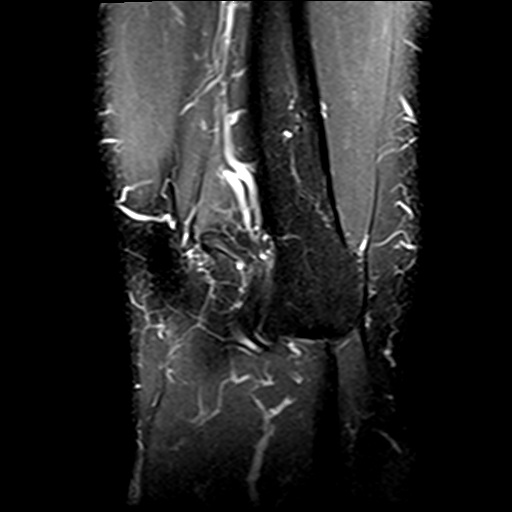

[Series 9: T2 fat-sat · sagittal · 3.0mm · 0.27mm/px · 3 of 21 slices shown (3 of 3)]
[im 4/21]
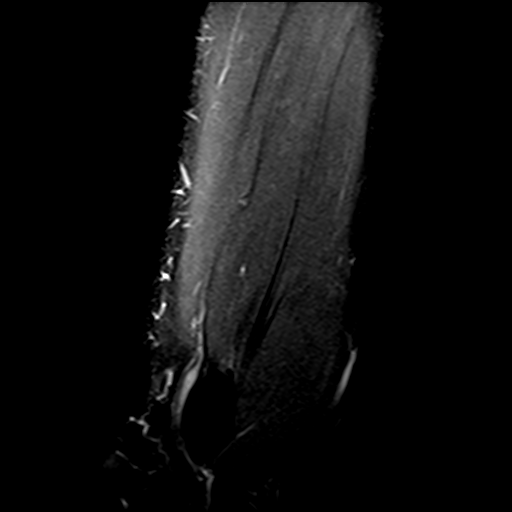
[im 11/21]
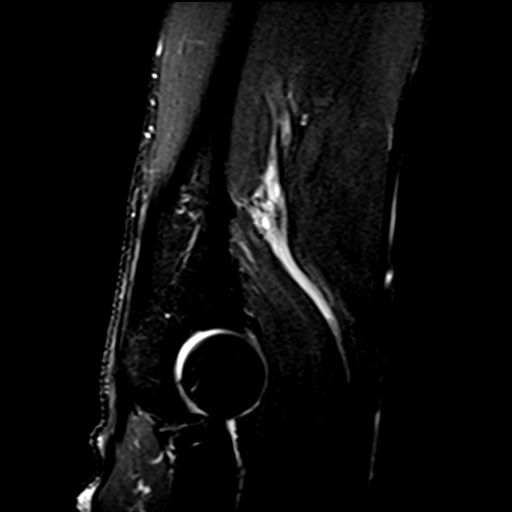
[im 17/21]
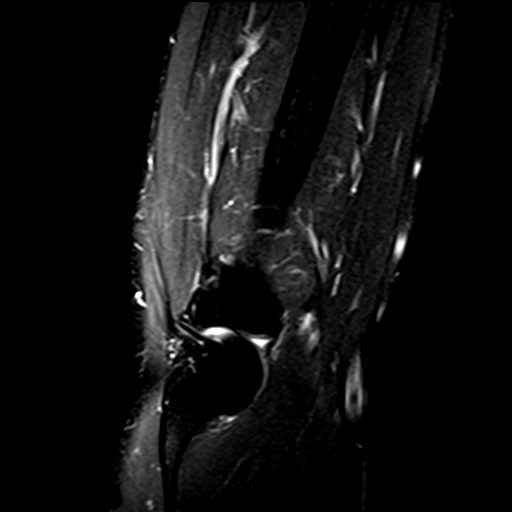

[19 of 40 positions shown; findings below may reference images not displayed]

FINDINGS: TENDONS

Common forearm flexor origin: Intact.

Common forearm extensor origin: Intact.

Biceps: The biceps tendon does not attach to the bicipital
tuberosity of the radius consistent with complete tear. There is no
edema along the expected course of the tendon consistent with
chronic injury. The proximal tendon stump is not visualized on this
study. When compared to the prior exam, the stump is likely in the
upper arm approximately 9 cm above the elbow joint. No muscle
atrophy is identified.

Triceps: Intact.

LIGAMENTS

Medial stabilizers: Intact.

Lateral stabilizers:  Intact.

Cartilage: Normal.

Joint: There is no effusion.

Cubital tunnel: Normal.

Bones: Normal marrow signal throughout.
IMPRESSION: Chronic, complete tear of the biceps tendon from the bicipital
tuberosity of the radius. The distal tendon stump is not visualized
on this examination but when correlated with the prior study appears
to be approximately 9 cm above the elbow joint. No muscle atrophy is
seen on the current exam.

## 2021-10-06 DIAGNOSIS — Z419 Encounter for procedure for purposes other than remedying health state, unspecified: Secondary | ICD-10-CM | POA: Diagnosis not present

## 2021-11-06 DIAGNOSIS — Z419 Encounter for procedure for purposes other than remedying health state, unspecified: Secondary | ICD-10-CM | POA: Diagnosis not present

## 2021-11-19 ENCOUNTER — Ambulatory Visit: Payer: Medicaid Other | Admitting: Critical Care Medicine
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
## 2021-12-06 DIAGNOSIS — Z419 Encounter for procedure for purposes other than remedying health state, unspecified: Secondary | ICD-10-CM | POA: Diagnosis not present
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
## 2021-12-31 ENCOUNTER — Ambulatory Visit: Payer: Medicaid Other | Attending: Critical Care Medicine | Admitting: Critical Care Medicine

## 2021-12-31 ENCOUNTER — Encounter: Payer: Self-pay | Admitting: Critical Care Medicine

## 2021-12-31 VITALS — BP 142/92 | HR 71 | Ht 69.0 in | Wt 167.6 lb

## 2021-12-31 DIAGNOSIS — F3160 Bipolar disorder, current episode mixed, unspecified: Secondary | ICD-10-CM

## 2021-12-31 DIAGNOSIS — F319 Bipolar disorder, unspecified: Secondary | ICD-10-CM | POA: Insufficient documentation

## 2021-12-31 DIAGNOSIS — E039 Hypothyroidism, unspecified: Secondary | ICD-10-CM | POA: Insufficient documentation

## 2021-12-31 DIAGNOSIS — Z79899 Other long term (current) drug therapy: Secondary | ICD-10-CM | POA: Diagnosis not present

## 2021-12-31 DIAGNOSIS — F17210 Nicotine dependence, cigarettes, uncomplicated: Secondary | ICD-10-CM | POA: Insufficient documentation

## 2021-12-31 DIAGNOSIS — Z79890 Hormone replacement therapy (postmenopausal): Secondary | ICD-10-CM | POA: Diagnosis not present

## 2021-12-31 DIAGNOSIS — S46219A Strain of muscle, fascia and tendon of other parts of biceps, unspecified arm, initial encounter: Secondary | ICD-10-CM

## 2021-12-31 DIAGNOSIS — I10 Essential (primary) hypertension: Secondary | ICD-10-CM | POA: Diagnosis not present

## 2021-12-31 DIAGNOSIS — Z720 Tobacco use: Secondary | ICD-10-CM

## 2021-12-31 MED ORDER — VENLAFAXINE HCL ER 75 MG PO CP24: 225.0000 mg | ORAL_CAPSULE | Freq: Every day | ORAL | 2 refills | Status: DC

## 2021-12-31 MED ORDER — BLOOD PRESSURE KIT DEVI: 0 refills | Status: AC

## 2021-12-31 MED ORDER — CYCLOBENZAPRINE HCL 10 MG PO TABS: 10.0000 mg | ORAL_TABLET | Freq: Three times a day (TID) | ORAL | 1 refills | Status: DC | PRN

## 2021-12-31 MED ORDER — BENZTROPINE MESYLATE 1 MG PO TABS: 1.0000 mg | ORAL_TABLET | Freq: Every day | ORAL | 1 refills | Status: DC

## 2021-12-31 MED ORDER — AMLODIPINE BESYLATE 10 MG PO TABS: 10.0000 mg | ORAL_TABLET | Freq: Every day | ORAL | 2 refills | Status: DC

## 2021-12-31 MED ORDER — CHLORTHALIDONE 25 MG PO TABS: 25.0000 mg | ORAL_TABLET | Freq: Every day | ORAL | 2 refills | Status: DC

## 2021-12-31 MED ORDER — PANTOPRAZOLE SODIUM 40 MG PO TBEC: 40.0000 mg | DELAYED_RELEASE_TABLET | Freq: Every day | ORAL | 3 refills | Status: DC

## 2021-12-31 MED ORDER — LEVOTHYROXINE SODIUM 100 MCG PO TABS: 100.0000 ug | ORAL_TABLET | Freq: Every day | ORAL | 2 refills | Status: DC

## 2021-12-31 MED ORDER — OLANZAPINE 7.5 MG PO TABS: 7.5000 mg | ORAL_TABLET | Freq: Every day | ORAL | 2 refills | Status: DC

## 2021-12-31 MED ORDER — PRAZOSIN HCL 1 MG PO CAPS: 1.0000 mg | ORAL_CAPSULE | Freq: Every day | ORAL | 2 refills | Status: DC

## 2021-12-31 NOTE — Patient Instructions (Signed)
Start chlorthalidone 1 pill daily for blood pressure and amlodipine will be increased to 10 mg daily refills on amlodipine and the new medication [facility]  [facility]ills on all other medications [facility]  A blood pressure meter will be mailed to you from [facility] this is covered under Medicaid  Return to see Dr. Delford Field 6 weeks bring your blood pressure readings with you the next visit

## 2021-12-31 NOTE — Assessment & Plan Note (Signed)
  .   Current smoking consumption amount: 1 pack every 3 days  . Dicsussion on advise to quit smoking and smoking impacts: Cardiovascular impacts  . Patient's willingness to quit: Willing to quit  . Methods to quit smoking discussed: Behavioral modification and nicotine lozenge  . Medication management of smoking session drugs discussed: Nicotine replacement  . Resources provided:  [facility]   . Setting quit date not established  . Follow-up arranged 6 weeks  Time spent counseling the patient: 5 minutes

## 2021-12-31 NOTE — Assessment & Plan Note (Signed)
Plan to resume Synthroid

## 2021-12-31 NOTE — Assessment & Plan Note (Signed)
Patient declines to receive surgical intervention

## 2021-12-31 NOTE — Assessment & Plan Note (Signed)
Have reordered mental health medications

## 2021-12-31 NOTE — Assessment & Plan Note (Signed)
Not controlled due to the fact that he is not taking medicines for 1 month  Plan to resume amlodipine at 10 mg daily and begin chlorthalidone 25 mg daily  Patient will receive a blood pressure meter mailed to him via Medicaid  We will have this patient return in short-term follow-up for reassessment's

## 2022-01-06 DIAGNOSIS — Z419 Encounter for procedure for purposes other than remedying health state, unspecified: Secondary | ICD-10-CM | POA: Diagnosis not present

## 2022-02-06 DIAGNOSIS — Z419 Encounter for procedure for purposes other than remedying health state, unspecified: Secondary | ICD-10-CM | POA: Diagnosis not present
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
## 2022-02-11 ENCOUNTER — Ambulatory Visit: Payer: Medicaid Other | Attending: Critical Care Medicine | Admitting: Critical Care Medicine

## 2022-02-11 ENCOUNTER — Encounter: Payer: Self-pay | Admitting: Critical Care Medicine

## 2022-02-11 VITALS — BP 123/84 | HR 65 | Temp 98.0°F | Ht 67.0 in | Wt 169.2 lb

## 2022-02-11 DIAGNOSIS — Z716 Tobacco abuse counseling: Secondary | ICD-10-CM | POA: Diagnosis not present

## 2022-02-11 DIAGNOSIS — Z79899 Other long term (current) drug therapy: Secondary | ICD-10-CM | POA: Insufficient documentation

## 2022-02-11 DIAGNOSIS — F17210 Nicotine dependence, cigarettes, uncomplicated: Secondary | ICD-10-CM | POA: Insufficient documentation

## 2022-02-11 DIAGNOSIS — Z720 Tobacco use: Secondary | ICD-10-CM

## 2022-02-11 DIAGNOSIS — I10 Essential (primary) hypertension: Secondary | ICD-10-CM | POA: Diagnosis not present

## 2022-02-11 DIAGNOSIS — M62838 Other muscle spasm: Secondary | ICD-10-CM | POA: Diagnosis not present

## 2022-02-11 DIAGNOSIS — E039 Hypothyroidism, unspecified: Secondary | ICD-10-CM | POA: Insufficient documentation

## 2022-02-11 NOTE — Assessment & Plan Note (Signed)
Blood pressure improved  We will check thyroid panel  No changes and will continue amlodipine and chlorthalidone

## 2022-02-11 NOTE — Assessment & Plan Note (Signed)
  .   Current smoking consumption amount: 1 pack every 3 days  . Dicsussion on advise to quit smoking and smoking impacts: Cardiovascular impacts  . Patient's willingness to quit: Willing to quit  . Methods to quit smoking discussed: Behavioral modification and nicotine lozenge  . Medication management of smoking session drugs discussed: Nicotine replacement  . Resources provided:  [facility]   . Setting quit date not established  . Follow-up arranged 6 weeks  Time spent counseling the patient: 5 minutes

## 2022-02-11 NOTE — Patient Instructions (Signed)
No change in medications  Continue to follow a healthy diet while you are waiting on your dentures consider taking your vegetables and putting them in a blender as a smoothie  Referral to [employer] our licensed clinical social work will be made she will give you a call  Cigarette intake as best you can  Return to Dr. Delford Field 4 months  Labs today include thyroid function

## 2022-02-11 NOTE — Assessment & Plan Note (Signed)
Patient is more consistent with Synthroid we will check a thyroid function

## 2022-02-12 LAB — THYROID PANEL WITH TSH
Free Thyroxine Index: 1.2 (ref 1.2–4.9)
T3 Uptake Ratio: 24 % (ref 24–39)
T4, Total: 4.9 ug/dL (ref 4.5–12.0)
TSH: 9.86 u[IU]/mL — ABNORMAL HIGH (ref 0.450–4.500)

## 2022-02-12 NOTE — Progress Notes (Signed)
Let pt know thyroid is properly replaced no change in thyroid dosing

## 2022-02-13 ENCOUNTER — Telehealth: Payer: Self-pay

## 2022-02-13 NOTE — Telephone Encounter (Signed)
-----   Message from Storm Frisk, MD sent at 02/12/2022  6:18 AM EDT ----- Let pt know thyroid is properly replaced no change in thyroid dosing

## 2022-02-13 NOTE — Telephone Encounter (Signed)
Pt was called and is aware of results, DOB was confirmed.  ?

## 2022-03-08 DIAGNOSIS — Z419 Encounter for procedure for purposes other than remedying health state, unspecified: Secondary | ICD-10-CM | POA: Diagnosis not present

## 2022-04-08 DIAGNOSIS — Z419 Encounter for procedure for purposes other than remedying health state, unspecified: Secondary | ICD-10-CM | POA: Diagnosis not present

## 2022-05-08 DIAGNOSIS — Z419 Encounter for procedure for purposes other than remedying health state, unspecified: Secondary | ICD-10-CM | POA: Diagnosis not present

## 2022-06-08 DIAGNOSIS — Z419 Encounter for procedure for purposes other than remedying health state, unspecified: Secondary | ICD-10-CM | POA: Diagnosis not present

## 2022-06-18 ENCOUNTER — Ambulatory Visit: Payer: Medicaid Other | Attending: Critical Care Medicine | Admitting: Critical Care Medicine

## 2022-06-18 ENCOUNTER — Encounter: Payer: Self-pay | Admitting: Critical Care Medicine

## 2022-06-18 VITALS — BP 158/116 | HR 80 | Ht 67.0 in | Wt 174.4 lb

## 2022-06-18 DIAGNOSIS — E039 Hypothyroidism, unspecified: Secondary | ICD-10-CM

## 2022-06-18 DIAGNOSIS — F3160 Bipolar disorder, current episode mixed, unspecified: Secondary | ICD-10-CM | POA: Diagnosis not present

## 2022-06-18 DIAGNOSIS — I10 Essential (primary) hypertension: Secondary | ICD-10-CM

## 2022-06-18 DIAGNOSIS — K068 Other specified disorders of gingiva and edentulous alveolar ridge: Secondary | ICD-10-CM

## 2022-06-18 DIAGNOSIS — K029 Dental caries, unspecified: Secondary | ICD-10-CM | POA: Insufficient documentation

## 2022-06-18 MED ORDER — PRAZOSIN HCL 1 MG PO CAPS: 1.0000 mg | ORAL_CAPSULE | Freq: Every day | ORAL | 2 refills | Status: DC

## 2022-06-18 MED ORDER — OLANZAPINE 7.5 MG PO TABS: 7.5000 mg | ORAL_TABLET | Freq: Every day | ORAL | 2 refills | Status: DC

## 2022-06-18 MED ORDER — AMLODIPINE BESYLATE 10 MG PO TABS: 10.0000 mg | ORAL_TABLET | Freq: Every day | ORAL | 2 refills | Status: DC

## 2022-06-18 MED ORDER — LEVOTHYROXINE SODIUM 100 MCG PO TABS: 100.0000 ug | ORAL_TABLET | Freq: Every day | ORAL | 2 refills | Status: DC

## 2022-06-18 MED ORDER — CYCLOBENZAPRINE HCL 10 MG PO TABS: 10.0000 mg | ORAL_TABLET | Freq: Three times a day (TID) | ORAL | 1 refills | Status: DC | PRN

## 2022-06-18 MED ORDER — VENLAFAXINE HCL ER 75 MG PO CP24: 225.0000 mg | ORAL_CAPSULE | Freq: Every day | ORAL | 2 refills | Status: DC

## 2022-06-18 MED ORDER — BENZTROPINE MESYLATE 1 MG PO TABS: 1.0000 mg | ORAL_TABLET | Freq: Every day | ORAL | 1 refills | Status: DC

## 2022-06-18 MED ORDER — PRED FORTE 1 % OP SUSP: OPHTHALMIC | 2 refills | Status: AC

## 2022-06-18 MED ORDER — CHLORTHALIDONE 25 MG PO TABS: 25.0000 mg | ORAL_TABLET | Freq: Every day | ORAL | 2 refills | Status: DC

## 2022-06-18 NOTE — Assessment & Plan Note (Signed)
Refill Synthroid and assess thyroid function

## 2022-06-18 NOTE — Patient Instructions (Addendum)
Screening labs obtained at this visit  Refills on all medications given please resume amlodipine daily and chlorthalidone daily for blood pressure  Give [place] a call in 2 to 3 weeks with your blood pressure readings on the blood pressure medication  Appointment with clinical pharmacist Lurena Joiner in 4 weeks and then Dr. Joya Gaskins 3 months  See below to achieve a new mental health provider at [facility] behavioral health as a walk-in dates and times to the clinic  Will see if a different dentist can take care of your denture issue will call you  Focus on reducing your tobacco intake  [facility] [address], [place], [place] [address] [phone] or [phone] Walk-in urgent care 24/7 for anyone  For [facility] ONLY New patient assessments and therapy walk-ins: Monday and Wednesday 8am-11am First and second Friday 1pm-5pm New patient psychiatry and medication management walk-ins: Mondays, Wednesdays, Thursdays, Fridays 8am-11am No psychiatry walk-ins on Tuesday

## 2022-06-18 NOTE — Assessment & Plan Note (Signed)
Blood pressure currently uncontrolled plan to resume amlodipine and chlorthalidone and obtain metabolic panel  Patient will follow-up short-term with clinical pharmacy
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
## 2022-06-18 NOTE — Assessment & Plan Note (Signed)
Gum pain will refer to dentistry

## 2022-06-18 NOTE — Assessment & Plan Note (Signed)
Patient no longer able to see Brian Weber he is on mental health medications we will refill same and refer him to psychiatrist that takes Medicaid  GAD-7 and PHQ-9 are adequate and no suicidal or homicidal ideation

## 2022-06-19 ENCOUNTER — Telehealth: Payer: Self-pay

## 2022-06-19 LAB — LIPID PANEL
Chol/HDL Ratio: 2.4 ratio (ref 0.0–5.0)
Cholesterol, Total: 137 mg/dL (ref 100–199)
HDL: 56 mg/dL (ref >39)
LDL Chol Calc (NIH): 67 mg/dL (ref 0–99)
Triglycerides: 66 mg/dL (ref 0–149)
VLDL Cholesterol Cal: 14 mg/dL (ref 5–40)

## 2022-06-19 LAB — COMPREHENSIVE METABOLIC PANEL
ALT: 12 IU/L (ref 0–44)
AST: 20 IU/L (ref 0–40)
Albumin/Globulin Ratio: 1.5 (ref 1.2–2.2)
Albumin: 4.8 g/dL (ref 3.8–4.9)
Alkaline Phosphatase: 109 IU/L (ref 44–121)
BUN/Creatinine Ratio: 7 — ABNORMAL LOW (ref 9–20)
BUN: 9 mg/dL (ref 6–24)
Bilirubin Total: 0.3 mg/dL (ref 0.0–1.2)
CO2: 25 mmol/L (ref 20–29)
Calcium: 9.5 mg/dL (ref 8.7–10.2)
Chloride: 102 mmol/L (ref 96–106)
Creatinine, Ser: 1.36 mg/dL — ABNORMAL HIGH (ref 0.76–1.27)
Globulin, Total: 3.1 g/dL (ref 1.5–4.5)
Glucose: 88 mg/dL (ref 70–99)
Potassium: 4.2 mmol/L (ref 3.5–5.2)
Sodium: 140 mmol/L (ref 134–144)
Total Protein: 7.9 g/dL (ref 6.0–8.5)
eGFR: 61 mL/min/{1.73_m2} (ref >59)

## 2022-06-19 LAB — CBC WITH DIFFERENTIAL/PLATELET
Basophils Absolute: 0 10*3/uL (ref 0.0–0.2)
Basos: 1 %
EOS (ABSOLUTE): 0.1 10*3/uL (ref 0.0–0.4)
Eos: 1 %
Hematocrit: 40.9 % (ref 37.5–51.0)
Hemoglobin: 13.4 g/dL (ref 13.0–17.7)
Immature Grans (Abs): 0 10*3/uL (ref 0.0–0.1)
Immature Granulocytes: 0 %
Lymphocytes Absolute: 2.1 10*3/uL (ref 0.7–3.1)
Lymphs: 36 %
MCH: 27.7 pg (ref 26.6–33.0)
MCHC: 32.8 g/dL (ref 31.5–35.7)
MCV: 85 fL (ref 79–97)
Monocytes Absolute: 0.4 10*3/uL (ref 0.1–0.9)
Monocytes: 7 %
Neutrophils Absolute: 3.2 10*3/uL (ref 1.4–7.0)
Neutrophils: 55 %
Platelets: 243 10*3/uL (ref 150–450)
RBC: 4.83 x10E6/uL (ref 4.14–5.80)
RDW: 13.9 % (ref 11.6–15.4)
WBC: 5.8 10*3/uL (ref 3.4–10.8)

## 2022-06-19 LAB — THYROID PANEL WITH TSH
Free Thyroxine Index: 1.2 (ref 1.2–4.9)
T3 Uptake Ratio: 25 % (ref 24–39)
T4, Total: 4.7 ug/dL (ref 4.5–12.0)
TSH: 18.7 u[IU]/mL — ABNORMAL HIGH (ref 0.450–4.500)

## 2022-06-19 NOTE — Telephone Encounter (Signed)
-----  Message from Elsie Stain, MD sent at 06/19/2022  5:59 AM EST ----- Let pt know all labs normal except thyroid low mak sure he takes his thyroid pill

## 2022-06-19 NOTE — Telephone Encounter (Signed)
Pt was called and is aware of results, DOB was confirmed.  ?

## 2022-06-23 ENCOUNTER — Other Ambulatory Visit: Payer: Self-pay

## 2022-07-09 DIAGNOSIS — Z419 Encounter for procedure for purposes other than remedying health state, unspecified: Secondary | ICD-10-CM | POA: Diagnosis not present

## 2022-07-16 ENCOUNTER — Ambulatory Visit: Payer: Medicaid Other | Admitting: Pharmacist

## 2022-08-04 ENCOUNTER — Ambulatory Visit: Payer: Medicaid Other | Admitting: Pharmacist

## 2022-08-07 DIAGNOSIS — Z419 Encounter for procedure for purposes other than remedying health state, unspecified: Secondary | ICD-10-CM | POA: Diagnosis not present

## 2022-08-19 ENCOUNTER — Ambulatory Visit (HOSPITAL_COMMUNITY): Payer: Medicaid Other | Admitting: Mental Health

## 2022-08-19 ENCOUNTER — Encounter (HOSPITAL_COMMUNITY): Payer: Self-pay

## 2022-08-19 DIAGNOSIS — F4310 Post-traumatic stress disorder, unspecified: Secondary | ICD-10-CM

## 2022-08-19 DIAGNOSIS — F1290 Cannabis use, unspecified, uncomplicated: Secondary | ICD-10-CM

## 2022-08-19 DIAGNOSIS — F3160 Bipolar disorder, current episode mixed, unspecified: Secondary | ICD-10-CM
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
## 2022-09-07 DIAGNOSIS — Z419 Encounter for procedure for purposes other than remedying health state, unspecified: Secondary | ICD-10-CM | POA: Diagnosis not present

## 2022-09-08 ENCOUNTER — Ambulatory Visit: Payer: Medicaid Other | Attending: Family Medicine | Admitting: Pharmacist

## 2022-09-08 ENCOUNTER — Encounter: Payer: Self-pay | Admitting: Pharmacist

## 2022-09-08 VITALS — BP 130/79 | HR 88

## 2022-09-08 DIAGNOSIS — I10 Essential (primary) hypertension: Secondary | ICD-10-CM

## 2022-09-08 NOTE — Progress Notes (Signed)
   S:     No chief complaint on file.  57 y.o. male who presents for hypertension evaluation, education, and management.  PMH is significant for HTN, hypothyroid, Bipolar I, tobacco use, pre-DM, cataracts of the R eye.  Patient was referred and last seen by Primary Care Provider, Dr. Joya Gaskins, on 06/18/2022. BP was 158/116 mmHg at that visit but he was without his antihypertensives.    Today, patient arrives in good spirits and presents without assistance. Denies dizziness, headache, blurred vision, swelling.   Patient reports hypertension is longstanding.   Family/Social history:  Fhx: DM Tobacco: 0.3 PPD current smoker Alcohol: none reported   Medication adherence reported. Patient has taken BP medications today.   Current antihypertensives include: amlodipine 10 mg daily, chlorthalidone 25 mg daily  Reported home BP readings: none  Patient reported dietary habits: -Compliant with sodium restriction  -Drinks tea daily but did not specify type   Patient-reported exercise habits:  -Endorses being active. Walks his puppy, Sugar, every morning.  O:  Vitals:   09/08/22 1523  BP: 130/79  Pulse: 88   Last 3 Office BP readings: BP Readings from Last 3 Encounters:  09/08/22 130/79  06/18/22 (!) 158/116  02/11/22 123/84    BMET    Component Value Date/Time   NA 140 06/18/2022 1000   K 4.2 06/18/2022 1000   CL 102 06/18/2022 1000   CO2 25 06/18/2022 1000   GLUCOSE 88 06/18/2022 1000   GLUCOSE 106 (H) 06/08/2021 1019   BUN 9 06/18/2022 1000   CREATININE 1.36 (H) 06/18/2022 1000   CALCIUM 9.5 06/18/2022 1000   GFRNONAA >60 06/08/2021 1019   GFRAA 61 05/23/2019 0950    Renal function: CrCl cannot be calculated (Patient's most recent lab result is older than the maximum 21 days allowed.).  Clinical ASCVD: No  The 10-year ASCVD risk score (Arnett DK, et al., 2019) is: 18.4%   Values used to calculate the score:     Age: 36 years     Sex: Male     Is Non-Hispanic  African American: Yes     Diabetic: No     Tobacco smoker: Yes     Systolic Blood Pressure: [id number] mmHg     Is BP treated: Yes     HDL Cholesterol: 56 mg/dL     Total Cholesterol: 137 mg/dL  Patient is participating in a Managed Medicaid Plan:  Yes    A/P: Hypertension longstanding currently at goal on current medications. BP goal < 130/80 mmHg. Medication adherence appears appropriate. Commended patient on this.  -Continued current regimen. Encouraged patient to continue taking medications before his visits.   -Patient educated on purpose, proper use, and potential adverse effects of amlodipine, chlorthalidone.  -F/u labs ordered - none today -Counseled on lifestyle modifications for blood pressure control including reduced dietary sodium, increased exercise, adequate sleep. -Encouraged patient to check BP at home and bring log of readings to next visit. Counseled on proper use of home BP cuff.   Results reviewed and written information provided.    Written patient instructions provided. Patient verbalized understanding of treatment plan.  Total time in face to face counseling 20 minutes.    Follow-up:  Pharmacist prn. PCP clinic visit 09/17/22.   Benard Halsted, PharmD, [facility] [place], [place] [phone]

## 2022-09-09 ENCOUNTER — Encounter: Payer: Self-pay | Admitting: Pharmacist

## 2022-09-17 ENCOUNTER — Ambulatory Visit: Payer: Medicaid Other | Attending: Critical Care Medicine | Admitting: Critical Care Medicine

## 2022-09-17 ENCOUNTER — Encounter: Payer: Self-pay | Admitting: Critical Care Medicine

## 2022-09-17 VITALS — BP 126/71 | HR 78 | Ht 67.0 in | Wt 166.4 lb

## 2022-09-17 DIAGNOSIS — F319 Bipolar disorder, unspecified: Secondary | ICD-10-CM | POA: Insufficient documentation

## 2022-09-17 DIAGNOSIS — E039 Hypothyroidism, unspecified: Secondary | ICD-10-CM | POA: Diagnosis not present

## 2022-09-17 DIAGNOSIS — I10 Essential (primary) hypertension: Secondary | ICD-10-CM

## 2022-09-17 DIAGNOSIS — Z716 Tobacco abuse counseling: Secondary | ICD-10-CM | POA: Insufficient documentation

## 2022-09-17 DIAGNOSIS — F17210 Nicotine dependence, cigarettes, uncomplicated: Secondary | ICD-10-CM | POA: Insufficient documentation

## 2022-09-17 DIAGNOSIS — Z79899 Other long term (current) drug therapy: Secondary | ICD-10-CM | POA: Insufficient documentation

## 2022-09-17 DIAGNOSIS — Z5986 Financial insecurity: Secondary | ICD-10-CM | POA: Insufficient documentation

## 2022-09-17 DIAGNOSIS — Z720 Tobacco use: Secondary | ICD-10-CM | POA: Diagnosis not present

## 2022-09-17 NOTE — Patient Instructions (Signed)
No change in medications  Refills will be sent in to [facility] Return 6 months Excellent blood pressure control

## 2022-09-17 NOTE — Assessment & Plan Note (Signed)
Hypertension now at goal continue with amlodipine 10 mg daily chlorthalidone daily

## 2022-09-17 NOTE — Progress Notes (Signed)
No concerns. 

## 2022-09-17 NOTE — Assessment & Plan Note (Signed)
    Current smoking consumption amount: 1 cigarette every few days Dicsussion on advise to quit smoking and smoking impacts: Cardiovascular impacts  Patient's willingness to quit: Willing to quit  Methods to quit smoking discussed: Behavioral modification and nicotine lozenge  Medication management of smoking session drugs discussed: Nicotine replacement  Resources provided:  AVS   Setting quit date not established  Follow-up arranged 6 months  Time spent counseling the patient: 5 minutes
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
## 2022-09-17 NOTE — Assessment & Plan Note (Signed)
Continue thyroid supplement 

## 2022-09-22 ENCOUNTER — Ambulatory Visit (HOSPITAL_COMMUNITY): Payer: Medicaid Other | Admitting: Mental Health

## 2022-09-23 ENCOUNTER — Ambulatory Visit (INDEPENDENT_AMBULATORY_CARE_PROVIDER_SITE_OTHER): Payer: Medicaid Other | Admitting: Mental Health

## 2022-09-23 DIAGNOSIS — F4310 Post-traumatic stress disorder, unspecified: Secondary | ICD-10-CM

## 2022-09-23 DIAGNOSIS — F3160 Bipolar disorder, current episode mixed, unspecified: Secondary | ICD-10-CM | POA: Diagnosis not present

## 2022-09-23 DIAGNOSIS — F1290 Cannabis use, unspecified, uncomplicated: Secondary | ICD-10-CM

## 2022-09-23 NOTE — Progress Notes (Signed)
   THERAPIST PROGRESS NOTE  Session Time: 8:05am (   Participation Level: Active  Behavioral Response: CasualAlertWNL  Type of Therapy: Individual Therapy  Treatment Goals addressed: STG: Brian Weber will increase management of moods (irritability/anxiety) AEB development of x 3 effective coping skills with ability to communicate effectively per self report with no irritable outburst within the next 90 days.   ProgressTowards Goals: Initial  Interventions: CBT and Supportive  Summary: Brian Weber is a 57 y.o. male who presents with dx of bipolar disorder, PTSD and Cannabis use. Currently endorses concerns related to anxiety and difficulty managing emotions. Presents alert and oriented; mood and affect adequate; pleasant in demeanor. Good eye-contact. Receptive to interventions. Reports ongoing stressor related to inability to see daughter and for her mother to continue to be "out there" and dodging engagement with him. Notes additional stressor related to food insecurity and ability to pay bills with fixed income. Notes most income goes towards his rent with little left over for food. Shares history of incarceration and engaged with therapist concerning increased hypervigilance and concerns for safety as a result. Able to process with therapist feelings of safety and being in danger. Shares can be very reactive in situations of conflict; denies physical harm to others but can become overworked easily. Engaged with clinician in discussion of reactive vs. Responding and working to increase control of his emotions. Engaged in deep breathing exercises and working to delay reactions and choosing responses. Agreeable to identifying what is 'in his box' in controlling others and events of the world and other stressors of things 'out of the box.' Agrees to practice deep breathing skills nad working to engage in cognitions to support in solutions. Denies SI/HI. Sxs unchanged, working to make progress with  goals.    Suicidal/Homicidal: Nowithout intent/plan  Therapist Response: Therapist engaged [employer] in therpay appointment. Reviewed intake assessment and educated on bounds of confidentiality and informed consent. Assessed current level of functioning, sxs management and level of stressors. Provided supportive feedback; validated feelings. Engaged [place] in education of anxiety and relationship to PTSD dx. Processed environment- prison vs. Community settings and concerns for safety and safety being on continuum. Explored reactiveness vs. Response and working to engage in coping to delay impulsive reactions. Explored use of coping skills and engaging in deep breathing support with anxiety. Educated on things in which he can and can not control and working to make a plan for realistic anxiety. Supported in referral for food stamp application and explored ability to follow up with utilities to explore options for reduction in bills. No safety concerns reported.   Plan: Return again in  x 4 weeks.  Diagnosis: Bipolar 1 disorder, mixed  Post traumatic stress disorder  Cannabis use, uncomplicated  Collaboration of Care: Other DSS- food stamps,   Patient/Guardian was advised Release of Information must be obtained prior to any record release in order to collaborate their care with an outside provider. Patient/Guardian was advised if they have not already done so to contact the registration department to sign all necessary forms in order for [place] to release information regarding their care.   Consent: Patient/Guardian gives verbal consent for treatment and assignment of benefits for services provided during this visit. Patient/Guardian expressed understanding and agreed to proceed.   Stephan Minister [place], [facility] [address] 09/23/2022

## 2022-09-29 ENCOUNTER — Encounter (HOSPITAL_COMMUNITY): Payer: Self-pay | Admitting: Physician Assistant

## 2022-09-29 ENCOUNTER — Ambulatory Visit (INDEPENDENT_AMBULATORY_CARE_PROVIDER_SITE_OTHER): Payer: Medicaid Other | Admitting: Physician Assistant

## 2022-09-29 VITALS — BP 157/88 | HR 90 | Ht 67.0 in | Wt 169.4 lb

## 2022-09-29 DIAGNOSIS — F4310 Post-traumatic stress disorder, unspecified: Secondary | ICD-10-CM | POA: Diagnosis not present

## 2022-09-29 DIAGNOSIS — F3160 Bipolar disorder, current episode mixed, unspecified: Secondary | ICD-10-CM

## 2022-09-29 DIAGNOSIS — F1290 Cannabis use, unspecified, uncomplicated: Secondary | ICD-10-CM | POA: Diagnosis not present

## 2022-09-29 MED ORDER — OLANZAPINE 10 MG PO TABS: 10.0000 mg | ORAL_TABLET | Freq: Every day | ORAL | 1 refills | Status: DC

## 2022-09-29 MED ORDER — VENLAFAXINE HCL ER 75 MG PO CP24: 225.0000 mg | ORAL_CAPSULE | Freq: Every day | ORAL | 1 refills | Status: DC

## 2022-09-29 MED ORDER — BENZTROPINE MESYLATE 1 MG PO TABS: 1.0000 mg | ORAL_TABLET | Freq: Every day | ORAL | 1 refills | Status: DC

## 2022-09-29 MED ORDER — PRAZOSIN HCL 2 MG PO CAPS: 2.0000 mg | ORAL_CAPSULE | Freq: Every day | ORAL | 1 refills | Status: DC
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
## 2022-10-07 DIAGNOSIS — Z419 Encounter for procedure for purposes other than remedying health state, unspecified: Secondary | ICD-10-CM | POA: Diagnosis not present

## 2022-10-28 ENCOUNTER — Ambulatory Visit (INDEPENDENT_AMBULATORY_CARE_PROVIDER_SITE_OTHER): Payer: Medicaid Other | Admitting: Mental Health

## 2022-10-28 DIAGNOSIS — F3160 Bipolar disorder, current episode mixed, unspecified: Secondary | ICD-10-CM

## 2022-10-28 DIAGNOSIS — F4310 Post-traumatic stress disorder, unspecified: Secondary | ICD-10-CM
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
## 2022-11-07 DIAGNOSIS — Z419 Encounter for procedure for purposes other than remedying health state, unspecified: Secondary | ICD-10-CM | POA: Diagnosis not present

## 2022-11-10 ENCOUNTER — Encounter (HOSPITAL_COMMUNITY): Payer: Self-pay | Admitting: Physician Assistant

## 2022-11-10 ENCOUNTER — Ambulatory Visit (INDEPENDENT_AMBULATORY_CARE_PROVIDER_SITE_OTHER): Payer: Medicaid Other | Admitting: Physician Assistant

## 2022-11-10 VITALS — BP 147/98 | HR 81 | Ht 67.0 in | Wt 167.0 lb

## 2022-11-10 DIAGNOSIS — F3160 Bipolar disorder, current episode mixed, unspecified: Secondary | ICD-10-CM

## 2022-11-10 DIAGNOSIS — F4310 Post-traumatic stress disorder, unspecified: Secondary | ICD-10-CM | POA: Diagnosis not present

## 2022-11-10 DIAGNOSIS — F411 Generalized anxiety disorder: Secondary | ICD-10-CM

## 2022-11-10 MED ORDER — PRAZOSIN HCL 2 MG PO CAPS: 2.0000 mg | ORAL_CAPSULE | Freq: Every day | ORAL | 1 refills | Status: DC

## 2022-11-10 MED ORDER — BENZTROPINE MESYLATE 1 MG PO TABS: 1.0000 mg | ORAL_TABLET | Freq: Every day | ORAL | 1 refills | Status: DC

## 2022-11-10 MED ORDER — OLANZAPINE 15 MG PO TABS: 15.0000 mg | ORAL_TABLET | Freq: Every day | ORAL | 1 refills | Status: DC

## 2022-11-10 MED ORDER — HYDROXYZINE HCL 10 MG PO TABS: 10.0000 mg | ORAL_TABLET | Freq: Three times a day (TID) | ORAL | 1 refills | Status: AC | PRN

## 2022-11-10 MED ORDER — VENLAFAXINE HCL ER 75 MG PO CP24: 225.0000 mg | ORAL_CAPSULE | Freq: Every day | ORAL | 1 refills | Status: DC
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
## 2022-12-07 DIAGNOSIS — Z419 Encounter for procedure for purposes other than remedying health state, unspecified: Secondary | ICD-10-CM | POA: Diagnosis not present

## 2022-12-08 ENCOUNTER — Ambulatory Visit (INDEPENDENT_AMBULATORY_CARE_PROVIDER_SITE_OTHER): Payer: Medicaid Other | Admitting: Mental Health

## 2022-12-08 DIAGNOSIS — F3160 Bipolar disorder, current episode mixed, unspecified: Secondary | ICD-10-CM

## 2022-12-08 DIAGNOSIS — F411 Generalized anxiety disorder: Secondary | ICD-10-CM

## 2022-12-08 DIAGNOSIS — F4310 Post-traumatic stress disorder, unspecified: Secondary | ICD-10-CM | POA: Diagnosis not present
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
## 2022-12-22 ENCOUNTER — Ambulatory Visit (INDEPENDENT_AMBULATORY_CARE_PROVIDER_SITE_OTHER): Payer: Medicaid Other | Admitting: Physician Assistant

## 2022-12-22 VITALS — BP 125/99 | HR 71 | Temp 97.7°F | Ht 67.0 in | Wt 161.0 lb

## 2022-12-22 DIAGNOSIS — F4310 Post-traumatic stress disorder, unspecified: Secondary | ICD-10-CM | POA: Diagnosis not present

## 2022-12-22 DIAGNOSIS — F411 Generalized anxiety disorder: Secondary | ICD-10-CM | POA: Diagnosis not present

## 2022-12-22 DIAGNOSIS — F3160 Bipolar disorder, current episode mixed, unspecified: Secondary | ICD-10-CM | POA: Diagnosis not present

## 2022-12-22 DIAGNOSIS — Z79899 Other long term (current) drug therapy: Secondary | ICD-10-CM

## 2022-12-22 MED ORDER — VENLAFAXINE HCL ER 75 MG PO CP24
225.0000 mg | ORAL_CAPSULE | Freq: Every day | ORAL | 2 refills | Status: DC
Start: 2022-12-22 — End: 2023-02-23

## 2022-12-22 MED ORDER — PRAZOSIN HCL 2 MG PO CAPS
2.0000 mg | ORAL_CAPSULE | Freq: Every day | ORAL | 2 refills | Status: DC
Start: 2022-12-22 — End: 2023-02-23

## 2022-12-22 MED ORDER — OLANZAPINE 15 MG PO TABS
15.0000 mg | ORAL_TABLET | Freq: Every day | ORAL | 2 refills | Status: DC
Start: 2022-12-22 — End: 2023-02-23

## 2022-12-22 MED ORDER — BENZTROPINE MESYLATE 1 MG PO TABS
1.0000 mg | ORAL_TABLET | Freq: Every day | ORAL | 2 refills | Status: DC
Start: 2022-12-22 — End: 2023-02-23
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
## 2023-01-07 DIAGNOSIS — Z419 Encounter for procedure for purposes other than remedying health state, unspecified: Secondary | ICD-10-CM | POA: Diagnosis not present

## 2023-01-26 ENCOUNTER — Ambulatory Visit (HOSPITAL_COMMUNITY): Payer: Medicaid Other | Admitting: Mental Health

## 2023-02-07 DIAGNOSIS — Z419 Encounter for procedure for purposes other than remedying health state, unspecified: Secondary | ICD-10-CM | POA: Diagnosis not present

## 2023-02-17 DIAGNOSIS — Z79899 Other long term (current) drug therapy: Secondary | ICD-10-CM | POA: Diagnosis not present

## 2023-02-23 ENCOUNTER — Ambulatory Visit (INDEPENDENT_AMBULATORY_CARE_PROVIDER_SITE_OTHER): Payer: Medicaid Other | Admitting: Physician Assistant

## 2023-02-23 ENCOUNTER — Encounter (HOSPITAL_COMMUNITY): Payer: Self-pay | Admitting: Physician Assistant

## 2023-02-23 DIAGNOSIS — F3160 Bipolar disorder, current episode mixed, unspecified: Secondary | ICD-10-CM | POA: Diagnosis not present

## 2023-02-23 DIAGNOSIS — F411 Generalized anxiety disorder: Secondary | ICD-10-CM

## 2023-02-23 DIAGNOSIS — F4310 Post-traumatic stress disorder, unspecified: Secondary | ICD-10-CM

## 2023-02-23 DIAGNOSIS — E039 Hypothyroidism, unspecified: Secondary | ICD-10-CM | POA: Diagnosis not present

## 2023-02-23 MED ORDER — BENZTROPINE MESYLATE 1 MG PO TABS
1.0000 mg | ORAL_TABLET | Freq: Every day | ORAL | 2 refills | Status: DC
Start: 2023-02-23 — End: 2023-06-22

## 2023-02-23 MED ORDER — VENLAFAXINE HCL ER 75 MG PO CP24
225.0000 mg | ORAL_CAPSULE | Freq: Every day | ORAL | 2 refills | Status: DC
Start: 2023-02-23 — End: 2023-06-22

## 2023-02-23 MED ORDER — PRAZOSIN HCL 2 MG PO CAPS: 2.0000 mg | ORAL_CAPSULE | Freq: Every day | ORAL | 2 refills | Status: DC

## 2023-02-23 MED ORDER — OLANZAPINE 15 MG PO TABS
15.0000 mg | ORAL_TABLET | Freq: Every day | ORAL | 2 refills | Status: DC
Start: 2023-02-23 — End: 2023-06-22

## 2023-02-23 MED ORDER — LEVOTHYROXINE SODIUM 100 MCG PO TABS
100.0000 ug | ORAL_TABLET | Freq: Every day | ORAL | 0 refills | Status: DC
Start: 2023-02-23 — End: 2023-03-19
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
## 2023-03-18 ENCOUNTER — Other Ambulatory Visit (HOSPITAL_COMMUNITY): Payer: Self-pay | Admitting: Physician Assistant

## 2023-03-18 DIAGNOSIS — E039 Hypothyroidism, unspecified: Secondary | ICD-10-CM

## 2023-03-23 ENCOUNTER — Ambulatory Visit: Payer: Medicaid Other | Admitting: Critical Care Medicine

## 2023-03-31 ENCOUNTER — Encounter: Payer: Self-pay | Admitting: Physician Assistant

## 2023-03-31 ENCOUNTER — Ambulatory Visit: Payer: Medicaid Other | Attending: Critical Care Medicine | Admitting: Physician Assistant

## 2023-03-31 VITALS — BP 131/79 | HR 82 | Wt 165.8 lb

## 2023-03-31 DIAGNOSIS — I10 Essential (primary) hypertension: Secondary | ICD-10-CM

## 2023-03-31 DIAGNOSIS — E039 Hypothyroidism, unspecified: Secondary | ICD-10-CM

## 2023-03-31 DIAGNOSIS — M5432 Sciatica, left side: Secondary | ICD-10-CM | POA: Diagnosis not present

## 2023-03-31 DIAGNOSIS — S46219A Strain of muscle, fascia and tendon of other parts of biceps, unspecified arm, initial encounter: Secondary | ICD-10-CM

## 2023-03-31 MED ORDER — LEVOTHYROXINE SODIUM 100 MCG PO TABS: 100.0000 ug | ORAL_TABLET | Freq: Every day | ORAL | 1 refills | Status: DC

## 2023-03-31 MED ORDER — AMLODIPINE BESYLATE 10 MG PO TABS
10.0000 mg | ORAL_TABLET | Freq: Every day | ORAL | 2 refills | Status: DC
Start: 2023-03-31 — End: 2023-06-24

## 2023-03-31 MED ORDER — CYCLOBENZAPRINE HCL 10 MG PO TABS
10.0000 mg | ORAL_TABLET | Freq: Three times a day (TID) | ORAL | 1 refills | Status: AC | PRN
Start: 2023-03-31 — End: ?

## 2023-03-31 NOTE — Patient Instructions (Signed)
Sciatica  Sciatica is pain, weakness, tingling, or loss of feeling (numbness) along the sciatic nerve. The sciatic nerve starts in the lower back and goes down the back of each leg. Sciatica usually affects one side of the body. Sciatica usually goes away on its own or with treatment. Sometimes, sciatica may come back. What are the causes? This condition happens when the sciatic nerve is pinched or has pressure put on it. This may be caused by: A disk in between the bones of the spine bulging out too far (herniated disk). Changes in the spinal disks due to aging. A condition that affects a muscle in the butt. Extra bone growth near the sciatic nerve. A break (fracture) of the area between your hip bones (pelvis). Pregnancy. Tumor. This is rare. What increases the risk? You are more likely to develop this condition if you: Play sports that put pressure or stress on the spine. Have poor strength and ease of movement (flexibility). Have had a back injury or back surgery. Sit for long periods of time. Do activities that involve bending or lifting over and over again. Are very overweight (obese). What are the signs or symptoms? Symptoms can vary from mild to very bad. They may include: Any of these problems in the lower back, leg, hip, or butt: Mild tingling, loss of feeling, or dull aches. A burning feeling. Sharp pains. Loss of feeling in the back of the calf or the sole of the foot. Leg weakness. Very bad back pain that makes it hard to move. These symptoms may get worse when you cough, sneeze, or laugh. They may also get worse when you sit or stand for long periods of time. How is this treated? This condition often gets better without any treatment. However, treatment may include: Changing or cutting back on physical activity when you have pain. Exercising, including strengthening and stretching. Putting ice or heat on the affected area. Shots of medicines to relieve pain and  swelling or to relax your muscles. Surgery. Follow these instructions at home: Medicines Take over-the-counter and prescription medicines only as told by your doctor. Ask your doctor if you should avoid driving or using machines while you are taking your medicine. Managing pain     If told, put ice on the affected area. To do this: Put ice in a plastic bag. Place a towel between your skin and the bag. Leave the ice on for 20 minutes, 2-3 times a day. If your skin turns bright red, take off the ice right away to prevent skin damage. The risk of skin damage is higher if you cannot feel pain, heat, or cold. If told, put heat on the affected area. Do this as often as told by your doctor. Use the heat source that your doctor tells you to use, such as a moist heat pack or a heating pad. Place a towel between your skin and the heat source. Leave the heat on for 20-30 minutes. If your skin turns bright red, take off the heat right away to prevent burns. The risk of burns is higher if you cannot feel pain, heat, or cold. Activity  Return to your normal activities when your doctor says that it is safe. Avoid activities that make your symptoms worse. Take short rests during the day. When you rest for a long time, do some physical activity or stretching between periods of rest. Avoid sitting for a long time without moving. Get up and move around at least one time each   hour. Do exercises and stretches as told by your doctor. Do not lift anything that is heavier than 10 lb (4.5 kg). Avoid lifting heavy things even when you do not have symptoms. Avoid lifting heavy things over and over. When you lift objects, always lift in a way that is safe for your body. To do this, you should: Bend your knees. Keep the object close to your body. Avoid twisting. General instructions Stay at a healthy weight. Wear comfortable shoes that support your feet. Avoid wearing high heels. Avoid sleeping on a mattress  that is too soft or too hard. You might have less pain if you sleep on a mattress that is firm enough to support your back. Contact a doctor if: Your pain is not controlled by medicine. Your pain does not get better. Your pain gets worse. Your pain lasts longer than 4 weeks. You lose weight without trying. Get help right away if: You cannot control when you pee (urinate) or poop (have a bowel movement). You have weakness in any of these areas and it gets worse: Lower back. The area between your hip bones. Butt. Legs. You have redness or swelling of your back. You have a burning feeling when you pee. Summary Sciatica is pain, weakness, tingling, or loss of feeling (numbness) along the sciatic nerve. This may include the lower back, legs, hips, and butt. This condition happens when the sciatic nerve is pinched or has pressure put on it. Treatment often includes rest, exercise, medicines, and putting ice or heat on the affected area. This information is not intended to replace advice given to you by your health care provider. Make sure you discuss any questions you have with your health care provider. Document Revised: 09/01/2021 Document Reviewed: 09/01/2021 Elsevier Patient Education  2024 Elsevier Inc.  

## 2023-03-31 NOTE — Progress Notes (Signed)
Patient ID: Brian Weber, male   DOB: 13-Jan-1966, 57 y.o.   MRN: [id number]   Brian Weber, is a 57 y.o. male  UKG:[id number]  JSE:[id number]  DOB - March 28, 1966  Chief Complaint  Patient presents with   Hypertension       Subjective:   Brian Weber is a 57 y.o. male here today for med RF.  He is doing well overall except for lower back pain in the L sciatic region.  He is requesting a RF of flexeril and referral for ortho to see him for his back.  He still has a lot of soreness in his R arm due to the bicep tear he has.  He has elected not to have surgery on it.  He needs RF on amlodipine.  Keeping appts with [facility] and they checked his thyroid 02/23/2023=TSH=6.110 and dose was adjusted from to 100 MCG.  Compliant with new dose.  Needs RF  No problems updated.  ALLERGIES: No Known Allergies  PAST MEDICAL HISTORY: Past Medical History:  Diagnosis Date   Bipolar 1 disorder, mixed (HCC) 2018   Managed with Zyprexa/Cogentin and Effexor.   Hypothyroidism (acquired)    On Synthroid   Recurrent syncope    2 episodes before 2020, 2 episodes in 2020    MEDICATIONS AT HOME: Prior to Admission medications   Medication Sig Start Date End Date Taking? Authorizing Provider  benztropine (COGENTIN) 1 MG tablet Take 1 tablet (1 mg total) by mouth at bedtime. 02/23/23  Yes Nwoko, Uchenna E, PA  Blood Pressure Monitoring (BLOOD PRESSURE KIT) DEVI Use to measure blood pressure 12/31/21  Yes Storm Frisk, MD  chlorthalidone (HYGROTON) 25 MG tablet Take 1 tablet (25 mg total) by mouth daily. 06/18/22  Yes Storm Frisk, MD  hydrOXYzine (ATARAX) 10 MG tablet Take 1 tablet (10 mg total) by mouth 3 (three) times daily as needed. 11/10/22  Yes Nwoko, Uchenna E, PA  OLANZapine (ZYPREXA) 15 MG tablet Take 1 tablet (15 mg total) by mouth at bedtime. 02/23/23  Yes Nwoko, Tommas Olp, PA  prazosin (MINIPRESS) 2 MG capsule Take 1 capsule (2 mg total) by mouth at bedtime. 02/23/23  Yes Nwoko, Uchenna E,  PA  PRED FORTE 1 % ophthalmic suspension 2 drops each eye daily 06/18/22  Yes Storm Frisk, MD  venlafaxine XR (EFFEXOR-XR) 75 MG 24 hr capsule Take 3 capsules (225 mg total) by mouth at bedtime. 02/23/23  Yes Nwoko, Uchenna E, PA  amLODipine (NORVASC) 10 MG tablet Take 1 tablet (10 mg total) by mouth daily. 03/31/23   Anders Simmonds, PA-C  cyclobenzaprine (FLEXERIL) 10 MG tablet Take 1 tablet (10 mg total) by mouth 3 (three) times daily as needed for muscle spasms. 03/31/23   Anders Simmonds, PA-C  levothyroxine (SYNTHROID) 100 MCG tablet Take 1 tablet (100 mcg total) by mouth daily before breakfast. 03/31/23   Jaelie Aguilera, Marzella Schlein, PA-C    ROS: Neg HEENT Neg resp Neg cardiac Neg GI Neg GU Neg MS Neg psych Neg neuro  Objective:   Vitals:   03/31/23 1008  BP: 131/79  Pulse: 82  SpO2: 99%  Weight: 165 lb 12.8 oz (75.2 kg)   Exam General appearance : Awake, alert, not in any distress. Speech Clear. Not toxic looking HEENT: Atraumatic and Normocephalic Neck: Supple, no JVD. No cervical lymphadenopathy.  Chest: Good air entry bilaterally, CTAB.  No rales/rhonchi/wheezing CVS: S1 S2 regular, no murmurs.  Extremities: B/L Lower Ext shows no edema, both legs are  warm to touch.  In sling for R bicep.  Lower back-no spiny TTP.  ROM about 80% normal.  LEDTR=intact B.  Neg SLR Neurology: Awake alert, and oriented X 3, CN II-XII intact, Non focal Skin: No Rash  Data Review Lab Results  Component Value Date   HGBA1C 5.8 (H) 09/18/2020   HGBA1C 5.9 (H) 05/23/2019    Assessment & Plan   1. Sciatica of left side No red flags - Ambulatory referral to Orthopedic Surgery - cyclobenzaprine (FLEXERIL) 10 MG tablet; Take 1 tablet (10 mg total) by mouth 3 (three) times daily as needed for muscle spasms.  Dispense: 60 tablet; Refill: 1  2. Hypothyroidism, unspecified type Was adjusted from to 1 month ago.  Too early to recheck.   - levothyroxine (SYNTHROID) 100 MCG  tablet; Take 1 tablet (100 mcg total) by mouth daily before breakfast.  Dispense: 90 tablet; Refill: 1  3. Primary hypertension controlled - amLODipine (NORVASC) 10 MG tablet; Take 1 tablet (10 mg total) by mouth daily.  Dispense: 90 tablet; Refill: 2  4. Biceps tendon tear Practice ROM with shoulder since wearing sling.   - Ambulatory referral to Orthopedic Surgery   Patient declined flu shot   Return in about 3 months (around 07/01/2023) for PCP for chronic conditions-please assign to new PCP.  The patient was given clear instructions to go to ER or return to medical center if symptoms don't improve, worsen or new problems develop. The patient verbalized understanding. The patient was told to call to get lab results if they haven't heard anything in the next week.      Georgian Co, PA-C [facility] [place], [place] [phone]   03/31/2023, 10:21 AM

## 2023-04-09 DIAGNOSIS — Z419 Encounter for procedure for purposes other than remedying health state, unspecified: Secondary | ICD-10-CM | POA: Diagnosis not present

## 2023-04-19 ENCOUNTER — Other Ambulatory Visit (INDEPENDENT_AMBULATORY_CARE_PROVIDER_SITE_OTHER): Payer: Medicaid Other

## 2023-04-19 ENCOUNTER — Ambulatory Visit (INDEPENDENT_AMBULATORY_CARE_PROVIDER_SITE_OTHER): Payer: Medicaid Other | Admitting: Surgical

## 2023-04-19 ENCOUNTER — Encounter: Payer: Self-pay | Admitting: Surgical

## 2023-04-19 VITALS — Ht 67.0 in | Wt 170.0 lb

## 2023-04-19 DIAGNOSIS — M545 Low back pain, unspecified: Secondary | ICD-10-CM

## 2023-04-19 DIAGNOSIS — M5412 Radiculopathy, cervical region: Secondary | ICD-10-CM | POA: Diagnosis not present

## 2023-04-19 DIAGNOSIS — M542 Cervicalgia: Secondary | ICD-10-CM | POA: Diagnosis not present

## 2023-04-19 NOTE — Progress Notes (Signed)
Office Visit Note   Patient: Brian Weber           Date of Birth: 10/09/1965           MRN: [id number] Visit Date: 04/19/2023 Requested by: Anders Simmonds, PA-C [address] [place] [address] [place],  [place] [address] PCP: Storm Frisk, MD  Subjective: Chief Complaint  Patient presents with   Lower Back - Pain   Right Arm - Pain    HPI: Brian Weber is a 57 y.o. male who presents to the office reporting right shoulder and arm pain as well as low back pain.  Patient describes low back pain for about 3 months without any history of injury.  Describes tightness sensation across the low back both on the right and left sides of the low back with 20 to 30 minutes of prolonged standing.  He notes it is difficult to lay down flat on his back due to the back pain.  He has no history of back surgery.  No radicular pain down the legs.  No groin pain.  No loss of bowel or bladder control or saddle anesthesia..  Regarding the right arm pain, he describes pain that is diffusely throughout the right shoulder as well as in the scapular region and in his bicep.  He has history of distal bicep rupture that was diagnosed on MRI scan from 2022 and chose not to pursue surgery at that time.  He describes sharp pain through the neck, scapular, arm region.  Pain does not radiate past the elbow.  Really does not have a lot of elbow pain.  Does have some numbness and tingling in this distribution as well.  No left arm symptoms.  He is right-hand dominant.  Does not work and is on disability.  In his free time he enjoys watching TV, watching the news, reading books.              ROS: All systems reviewed are negative as they relate to the chief complaint within the history of present illness.  Patient denies fevers or chills.  Assessment & Plan: Visit Diagnoses:  1. Lumbar spine pain   2. Neck pain   3. Cervicalgia   4. Radiculopathy, cervical region     Plan: Patient is a 57 year old male who presents  for evaluation of right shoulder pain and low back pain.  Has had low back pain for about 3 months and this seems most consistent with likely facet joint mediated pain given the axial distribution and degenerative changes noted on radiographs today.  After discussion of options, patient would like to try physical therapy upstairs for lumbar spine exercises.  Regarding the right shoulder pain, he has diffuse pain distribution throughout the neck, trapezius, scapular, shoulder regions with some radiation into the bicep as well.  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  He had radiographs of his cervical spine demonstrating severe degenerative changes at multiple levels.  Plan for MRI of the cervical spine to evaluate right-sided radiculopathy in the setting of his chronic pain with associated numbness and tingling and severe cervical spine degenerative changes.  Follow-Up Instructions: No follow-ups on file.   Orders:  Orders Placed This Encounter  Procedures   XR Lumbar Spine 2-3 Views   XR Cervical Spine 2 or 3 views   MR Cervical Spine w/o contrast   Ambulatory referral to Physical Therapy  No orders of the defined types were placed in this encounter.     Procedures: No procedures performed   Clinical Data: No additional findings.  Objective: Vital Signs: Ht 5\' 7"  (1.702 m)   Wt 170 lb (77.1 kg)   BMI 26.63 kg/m   Physical Exam:  Constitutional: Patient appears well-developed HEENT:  Head: Normocephalic Eyes:EOM are normal Neck: Normal range of motion Cardiovascular: Normal rate Pulmonary/chest: Effort normal Neurologic: Patient is alert Skin: Skin is warm Psychiatric: Patient has normal mood and affect  Ortho Exam: Ortho exam demonstrates left shoulder with 45 degrees X rotation, 120 degrees abduction, 160 degrees forward elevation passively and actively.  This  compared with the right shoulder with 40 degrees X rotation, 120 degrees abduction, 160 degrees forward elevation passively and actively.  He has excellent rotator cuff strength of supra, infra, subscap rated 5/5.  Does have some tenderness over the bicipital groove moderately and AC joint moderately of the right shoulder.  Does have deformity of the bicep muscle noted consistent with distal bicep tendon rupture.  He does have excellent EPL, FPL, finger abduction, pronation/supination, bicep, tricep, deltoid strength rated 5/5 bilaterally.  There is no significant difference in his bicep flexion and supination strength of the right arm relative to the left.  Does have stiffness and pain reproduced with active cervical spine range of motion.  Negative Spurling sign.  Positive Lhermitte sign reproducing trapezius pain and scapular pain.  Intact hip flexion, quadricep, hamstring, dorsiflexion, plantarflexion, EHL rated 5/5 bilaterally.  Negative straight leg raise bilaterally.  Does have some tenderness throughout the axial lumbar spine and right and left-sided paraspinal musculature of the lumbar spine.  Specialty Comments:  No specialty comments available.  Imaging: No results found.   PMFS History: Patient Active Problem List   Diagnosis Date Noted   Generalized anxiety disorder 11/10/2022   Post traumatic stress disorder 08/19/2022   Cannabis use, uncomplicated 08/19/2022   Pain in gums 06/18/2022   Hypertension 02/06/2021   Age-related cataract of right eye 02/06/2021   Prediabetes 09/19/2020   Biceps tendon tear 09/18/2020   Bipolar 1 disorder, mixed (HCC) 09/18/2020   Tobacco use 09/18/2020   Hypothyroidism 05/19/2019   Past Medical History:  Diagnosis Date   Bipolar 1 disorder, mixed (HCC) 2018   Managed with Zyprexa/Cogentin and Effexor.   Hypothyroidism (acquired)    On Synthroid   Recurrent syncope    2 episodes before 2020, 2 episodes in 2020    Family History  Problem  Relation Age of Onset   Diabetes Mother     Past Surgical History:  Procedure Laterality Date   DENTAL RESTORATION/EXTRACTION WITH X-RAY     Social History   Occupational History   Not on file  Tobacco Use   Smoking status: Every Day    Current packs/day: 0.30    Average packs/day: 0.3 packs/day for 10.0 years (3.0 ttl pk-yrs)    Types: Cigarettes   Smokeless tobacco: Never  Vaping Use   Vaping status: Never Used  Substance and Sexual Activity   Alcohol use: No   Drug use: Yes    Types: Marijuana   Sexual activity: Not on file

## 2023-04-27 ENCOUNTER — Encounter (HOSPITAL_COMMUNITY): Payer: Medicaid Other | Admitting: Physician Assistant

## 2023-04-29 ENCOUNTER — Ambulatory Visit: Payer: Medicaid Other | Attending: Surgical | Admitting: Physical Therapy

## 2023-05-09 DIAGNOSIS — Z419 Encounter for procedure for purposes other than remedying health state, unspecified: Secondary | ICD-10-CM | POA: Diagnosis not present

## 2023-05-11 ENCOUNTER — Encounter: Payer: Self-pay | Admitting: Surgical

## 2023-05-17 ENCOUNTER — Other Ambulatory Visit: Payer: Medicaid Other

## 2023-06-09 DIAGNOSIS — Z419 Encounter for procedure for purposes other than remedying health state, unspecified: Secondary | ICD-10-CM | POA: Diagnosis not present

## 2023-06-22 ENCOUNTER — Telehealth (HOSPITAL_COMMUNITY): Payer: Self-pay

## 2023-06-22 ENCOUNTER — Ambulatory Visit (INDEPENDENT_AMBULATORY_CARE_PROVIDER_SITE_OTHER): Payer: Medicaid Other | Admitting: Physician Assistant

## 2023-06-22 ENCOUNTER — Other Ambulatory Visit (HOSPITAL_COMMUNITY): Payer: Self-pay | Admitting: Physician Assistant

## 2023-06-22 DIAGNOSIS — F3160 Bipolar disorder, current episode mixed, unspecified: Secondary | ICD-10-CM | POA: Diagnosis not present

## 2023-06-22 DIAGNOSIS — F411 Generalized anxiety disorder: Secondary | ICD-10-CM | POA: Diagnosis not present

## 2023-06-22 DIAGNOSIS — F4310 Post-traumatic stress disorder, unspecified: Secondary | ICD-10-CM | POA: Diagnosis not present

## 2023-06-22 MED ORDER — OLANZAPINE 5 MG PO TABS: ORAL_TABLET | ORAL | 1 refills | Status: DC

## 2023-06-22 MED ORDER — VENLAFAXINE HCL ER 150 MG PO CP24: 150.0000 mg | ORAL_CAPSULE | Freq: Every day | ORAL | 1 refills | Status: DC

## 2023-06-22 MED ORDER — PRAZOSIN HCL 2 MG PO CAPS: 2.0000 mg | ORAL_CAPSULE | Freq: Every day | ORAL | 1 refills | Status: DC

## 2023-06-22 MED ORDER — BENZTROPINE MESYLATE 1 MG PO TABS: 1.0000 mg | ORAL_TABLET | Freq: Every day | ORAL | 1 refills | Status: DC

## 2023-06-22 MED ORDER — VENLAFAXINE HCL ER 37.5 MG PO CP24: ORAL_CAPSULE | ORAL | 0 refills | Status: AC

## 2023-06-22 NOTE — Telephone Encounter (Signed)
 While checking out the PT came up to my desk leaned in closely and said " I am going to go home and play with my Bitch " - .Marland Kitchen PT walked away turned around and said that's my dog - I told the PT I hope he has a good day.
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
## 2023-06-23 ENCOUNTER — Encounter (HOSPITAL_COMMUNITY): Payer: Self-pay | Admitting: Physician Assistant

## 2023-06-23 ENCOUNTER — Other Ambulatory Visit (HOSPITAL_COMMUNITY): Payer: Self-pay | Admitting: Physician Assistant

## 2023-06-23 DIAGNOSIS — F3160 Bipolar disorder, current episode mixed, unspecified: Secondary | ICD-10-CM

## 2023-06-24 ENCOUNTER — Encounter: Payer: Self-pay | Admitting: Family Medicine

## 2023-06-24 ENCOUNTER — Ambulatory Visit: Payer: Medicaid Other | Attending: Family Medicine | Admitting: Family Medicine

## 2023-06-24 VITALS — BP 138/78 | HR 60 | Ht 67.0 in | Wt 171.0 lb

## 2023-06-24 DIAGNOSIS — F3160 Bipolar disorder, current episode mixed, unspecified: Secondary | ICD-10-CM

## 2023-06-24 DIAGNOSIS — I10 Essential (primary) hypertension: Secondary | ICD-10-CM | POA: Diagnosis not present

## 2023-06-24 DIAGNOSIS — Z139 Encounter for screening, unspecified: Secondary | ICD-10-CM

## 2023-06-24 DIAGNOSIS — R7303 Prediabetes: Secondary | ICD-10-CM | POA: Diagnosis not present

## 2023-06-24 DIAGNOSIS — Z79899 Other long term (current) drug therapy: Secondary | ICD-10-CM | POA: Diagnosis not present

## 2023-06-24 DIAGNOSIS — E039 Hypothyroidism, unspecified: Secondary | ICD-10-CM | POA: Diagnosis not present

## 2023-06-24 DIAGNOSIS — Z720 Tobacco use: Secondary | ICD-10-CM

## 2023-06-24 MED ORDER — CHLORTHALIDONE 25 MG PO TABS: 25.0000 mg | ORAL_TABLET | Freq: Every day | ORAL | 1 refills | Status: DC

## 2023-06-24 MED ORDER — AMLODIPINE BESYLATE 10 MG PO TABS: 10.0000 mg | ORAL_TABLET | Freq: Every day | ORAL | 1 refills | Status: DC

## 2023-06-24 NOTE — Patient Instructions (Signed)
VISIT SUMMARY:  Today, you came in for a routine follow-up for your hypertension, hypothyroidism, and prediabetes. You reported no new concerns or symptoms. We also discussed your ongoing management of bipolar disorder and anxiety, as well as your smoking habits. Your thyroid lab from last fall was abnormal, so we will recheck it today. You are currently on a low salt diet and getting regular exercise.  YOUR PLAN:  -HYPERTENSION: Hypertension, or high blood pressure, is well controlled with your current medications, Amlodipine and Chlorthalidone. Continue taking these medications as prescribed.  -HYPOTHYROIDISM: Hypothyroidism is a condition where your thyroid does not produce enough hormones. We will check your thyroid levels today and adjust your Levothyroxine dose based on the results.  -PREDIABETES: Prediabetes means your blood sugar levels are higher than normal but not high enough to be classified as diabetes. We will do routine blood work today to monitor your condition.  -BIPOLAR DISORDER AND ANXIETY: Your bipolar disorder and anxiety are being managed by behavioral health, and no changes to your current management plan are needed at this time.  -TOBACCO USE: You are currently smoking less than a pack a day and acknowledge the need to quit. Continue your efforts to quit smoking, even though you are not ready to use medical aids at this time.  -GENERAL HEALTH MAINTENANCE: Continue with your low salt diet and regular exercise. We will refill your prescriptions as needed and follow up on your lab results.  INSTRUCTIONS:  Please follow up on your lab results once they are available. Continue taking your medications as prescribed and maintain your low salt diet and regular exercise. If you have any new symptoms or concerns, please schedule an appointment.
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
## 2023-06-25 LAB — CMP14+EGFR
ALT: 11 [IU]/L (ref 0–44)
AST: 23 [IU]/L (ref 0–40)
Albumin: 4.4 g/dL (ref 3.8–4.9)
Alkaline Phosphatase: 103 [IU]/L (ref 44–121)
BUN/Creatinine Ratio: 7 — ABNORMAL LOW (ref 9–20)
BUN: 11 mg/dL (ref 6–24)
Bilirubin Total: 0.4 mg/dL (ref 0.0–1.2)
CO2: 25 mmol/L (ref 20–29)
Calcium: 9 mg/dL (ref 8.7–10.2)
Chloride: 103 mmol/L (ref 96–106)
Creatinine, Ser: 1.51 mg/dL — ABNORMAL HIGH (ref 0.76–1.27)
Globulin, Total: 2.6 g/dL (ref 1.5–4.5)
Glucose: 90 mg/dL (ref 70–99)
Potassium: 4.5 mmol/L (ref 3.5–5.2)
Sodium: 140 mmol/L (ref 134–144)
Total Protein: 7 g/dL (ref 6.0–8.5)
eGFR: 53 mL/min/{1.73_m2} — ABNORMAL LOW (ref >59)

## 2023-06-25 LAB — LP+NON-HDL CHOLESTEROL
Cholesterol, Total: 133 mg/dL (ref 100–199)
HDL: 55 mg/dL (ref >39)
LDL Chol Calc (NIH): 65 mg/dL (ref 0–99)
Total Non-HDL-Chol (LDL+VLDL): 78 mg/dL (ref 0–129)
Triglycerides: 65 mg/dL (ref 0–149)
VLDL Cholesterol Cal: 13 mg/dL (ref 5–40)

## 2023-06-25 LAB — TSH: TSH: 9.81 u[IU]/mL — ABNORMAL HIGH (ref 0.450–4.500)

## 2023-06-25 LAB — CBC WITH DIFFERENTIAL/PLATELET
Basophils Absolute: 0 10*3/uL (ref 0.0–0.2)
Basos: 0 %
EOS (ABSOLUTE): 0.1 10*3/uL (ref 0.0–0.4)
Eos: 1 %
Hematocrit: 41.1 % (ref 37.5–51.0)
Hemoglobin: 13.3 g/dL (ref 13.0–17.7)
Immature Grans (Abs): 0 10*3/uL (ref 0.0–0.1)
Immature Granulocytes: 0 %
Lymphocytes Absolute: 2.2 10*3/uL (ref 0.7–3.1)
Lymphs: 44 %
MCH: 27.7 pg (ref 26.6–33.0)
MCHC: 32.4 g/dL (ref 31.5–35.7)
MCV: 85 fL (ref 79–97)
Monocytes Absolute: 0.5 10*3/uL (ref 0.1–0.9)
Monocytes: 9 %
Neutrophils Absolute: 2.2 10*3/uL (ref 1.4–7.0)
Neutrophils: 46 %
Platelets: 224 10*3/uL (ref 150–450)
RBC: 4.81 x10E6/uL (ref 4.14–5.80)
RDW: 13.8 % (ref 11.6–15.4)
WBC: 5 10*3/uL (ref 3.4–10.8)

## 2023-06-25 LAB — HEMOGLOBIN A1C
Est. average glucose Bld gHb Est-mCnc: 126 mg/dL
Hgb A1c MFr Bld: 6 % — ABNORMAL HIGH (ref 4.8–5.6)

## 2023-06-25 LAB — T4, FREE: Free T4: 0.83 ng/dL (ref 0.82–1.77)

## 2023-06-25 LAB — T3: T3, Total: 95 ng/dL (ref 71–180)

## 2023-06-28 ENCOUNTER — Other Ambulatory Visit: Payer: Self-pay | Admitting: Family Medicine

## 2023-06-28 ENCOUNTER — Encounter: Payer: Self-pay | Admitting: Family Medicine

## 2023-06-28 DIAGNOSIS — E039 Hypothyroidism, unspecified: Secondary | ICD-10-CM

## 2023-06-28 MED ORDER — LEVOTHYROXINE SODIUM 112 MCG PO TABS: 112.0000 ug | ORAL_TABLET | Freq: Every day | ORAL | 1 refills | Status: DC

## 2023-07-10 DIAGNOSIS — Z419 Encounter for procedure for purposes other than remedying health state, unspecified: Secondary | ICD-10-CM | POA: Diagnosis not present

## 2023-07-13 ENCOUNTER — Other Ambulatory Visit: Payer: Self-pay

## 2023-07-13 NOTE — Patient Instructions (Signed)
 Visit Information  Mr. Brian Weber was given information about Medicaid Managed Care team care coordination services as a part of their [facility] Medicaid benefit. Brian Weber verbally consented to engagement with the [facility] Managed Care team.   If you are experiencing a medical emergency, please call 911 or report to your local emergency department or urgent care.   If you have a non-emergency medical problem during routine business hours, please contact your provider's office and ask to speak with a nurse.   For questions related to your [facility] health plan, please call: [phone] or go here:[email or web address]  If you would like to schedule transportation through your [facility] plan, please call the following number at least 2 days in advance of your appointment: [phone].   You can also use the MTM portal or MTM mobile app to manage your rides. Reimbursement for transportation is available through [facility]! For the portal, please go to [email or web address]  Call the [facility] Crisis Line at [phone], at any time, 24 hours a day, 7 days a week. If you are in danger or need immediate medical attention call 911.  If you would like help to quit smoking, call 1-800-QUIT-NOW ((332)597-4947) OR Espaol: 1-855-Djelo-Ya (8-144-664-6430) o para ms informacin haga clic aqu or Text READY to 799-599 to register via text  Mr. Hulsebus - following are the goals we discussed in your visit today:   Goals Addressed   None     Social Worker will follow up in 30 days.   Brian Weber, HEDWIG, MHA [facility]  Managed Medicaid Social Worker [phone]   Following is a copy of your plan of care:  There are no care plans that you recently modified to display for this patient.

## 2023-07-13 NOTE — Patient Outreach (Signed)
 Medicaid Managed Care Social Work Note  07/13/2023 Name:  Brian Weber MRN:  [id number] DOB:  [birth year]  Brian Weber is an 58 y.o. year old male who is a primary patient of Brian Weber BRAVO, MD.  The Medicaid Managed Care Coordination team was consulted for assistance with:  Food Insecurity Community Resources   Mr. Brian Weber was given information about Medicaid Managed Care Coordination team services today. Brian Weber Patient agreed to services and verbal consent obtained.  Engaged with patient  for by telephone forinitial visit in response to referral for case management and/or care coordination services.   Patient is participating in a Managed Medicaid Plan:  Yes  Assessments/Interventions:  Review of past medical history, allergies, medications, health status, including review of consultants reports, laboratory and other test data, was performed as part of comprehensive evaluation and provision of chronic care management services.  SDOH: (Social Drivers of Health) assessments and interventions performed: SDOH Interventions    Flowsheet Row Office Visit from 06/24/2023 in [facility] [place] - A Dept Of [place]. [facility] Office Visit from 09/08/2022 in [facility] - A Dept Of [name]. [facility]  SDOH Interventions    Food Insecurity Interventions AMB Referral Patient Refused  Housing Interventions Intervention Not Indicated Intervention Not Indicated  Transportation Interventions Intervention Not Indicated Intervention Not Indicated  Utilities Interventions Intervention Not Indicated --  Alcohol Usage Interventions Intervention Not Indicated (Score <7) Intervention Not Indicated (Score <7)  Depression Interventions/Treatment  -- Currently on Treatment, Counseling  [Known psychiatric hx - sees behavioral health and is on treatment]  Financial Strain Interventions Other (Comment) Other (Comment)  [Patient is able to afford his  medications at this time.]  Stress Interventions -- Provide Counseling  [Patient has known psychiatric conditions and is treated by a behavioral health specialist]  Social Connections Interventions Intervention Not Indicated Patient Refused  Health Literacy Interventions Intervention Not Indicated --     BSW completed a telephone outreach with patient, he states he would like resources for housing, food, and utilities. Patient states he receives 947 each month and does receive foodstamps. His rent is 900 and he has placed himself on the waitlist for the housing authority. BSW and patient agreed for resources to be mailed to address on file. Patient does not have to be out of his home by a specific date.   Advanced Directives Status:  Not addressed in this encounter.  Care Plan                 No Known Allergies  Medications Reviewed Today   Medications were not reviewed in this encounter     Patient Active Problem List   Diagnosis Date Noted   Generalized anxiety disorder 11/10/2022   Post traumatic stress disorder 08/19/2022   Cannabis use, uncomplicated 08/19/2022   Pain in gums 06/18/2022   Hypertension 02/06/2021   Age-related cataract of right eye 02/06/2021   Prediabetes 09/19/2020   Biceps tendon tear 09/18/2020   Bipolar 1 disorder, mixed (HCC) 09/18/2020   Tobacco use 09/18/2020   Hypothyroidism 05/19/2019    Conditions to be addressed/monitored per PCP order:   community resources  There are no care plans that you recently modified to display for this patient.   Follow up:  Patient agrees to Care Plan and Follow-up.  Plan: The Managed Medicaid care management team will reach out to the patient again over the next 30 days.  Date/time of next scheduled Social  Work care management/care coordination outreach:  08/10/23  Thersia Hoar, HEDWIG, [facility]  Managed [facility] Social Worker [phone]

## 2023-08-07 DIAGNOSIS — Z419 Encounter for procedure for purposes other than remedying health state, unspecified: Secondary | ICD-10-CM | POA: Diagnosis not present

## 2023-08-10 ENCOUNTER — Other Ambulatory Visit: Payer: Self-pay

## 2023-08-10 NOTE — Patient Outreach (Signed)
  Medicaid Managed Care Social Work Note  08/10/2023 Name:  Brian Weber MRN:  [id number] DOB:  [birth year]  Brian Weber is an 57 y.o. year old male who is a primary patient of Brian Field Charlcie Cradle, MD.  The Medicaid Managed Care Coordination team was consulted for assistance with:  Community Resources   Brian Weber was given information about Medicaid Managed Care Coordination team services today. Brian Weber Patient agreed to services and verbal consent obtained.  Engaged with patient  for by telephone forfollow up visit in response to referral for case management and/or care coordination services.   Patient is participating in a Managed Medicaid Plan:  Yes  Assessments/Interventions:  Review of past medical history, allergies, medications, health status, including review of consultants reports, laboratory and other test data, was performed as part of comprehensive evaluation and provision of chronic care management services.  SDOH: (Social Drivers of Health) assessments and interventions performed: SDOH Interventions    Flowsheet Row Office Visit from 06/24/2023 in [facility] [place] - A Dept Of [place]. [facility] Office Visit from 09/08/2022 in [facility] - A Dept Of Eligha Bridegroom. [facility]  SDOH Interventions    Food Insecurity Interventions AMB Referral Patient Refused  Housing Interventions Intervention Not Indicated Intervention Not Indicated  Transportation Interventions Intervention Not Indicated Intervention Not Indicated  Utilities Interventions Intervention Not Indicated --  Alcohol Usage Interventions Intervention Not Indicated (Score <7) Intervention Not Indicated (Score <7)  Depression Interventions/Treatment  -- Currently on Treatment, Counseling  [Known psychiatric hx - sees behavioral health and is on treatment]  Financial Strain Interventions Other (Comment) Other (Comment)  [Patient is able to afford his medications at  this time.]  Stress Interventions -- Provide Counseling  [Patient has known psychiatric conditions and is treated by a behavioral health specialist]  Social Connections Interventions Intervention Not Indicated Patient Refused  Health Literacy Interventions Intervention Not Indicated --     BSW completed a telephone outreach with patient, he states he did receive the resources BSW sent. He is now in the waiting process for the housing authority. Patient states no other resources are needed at this time. BSW provided patient with contact information for any future needs.   Advanced Directives Status:  Not addressed in this encounter.  Care Plan                 No Known Allergies  Medications Reviewed Today   Medications were not reviewed in this encounter     Patient Active Problem List   Diagnosis Date Noted   Generalized anxiety disorder 11/10/2022   Post traumatic stress disorder 08/19/2022   Cannabis use, uncomplicated 08/19/2022   Pain in gums 06/18/2022   Hypertension 02/06/2021   Age-related cataract of right eye 02/06/2021   Prediabetes 09/19/2020   Biceps tendon tear 09/18/2020   Bipolar 1 disorder, mixed (HCC) 09/18/2020   Tobacco use 09/18/2020   Hypothyroidism 05/19/2019    Conditions to be addressed/monitored per PCP order:   community resources  There are no care plans that you recently modified to display for this patient.   Follow up:  Patient agrees to Care Plan and Follow-up.  Plan: The  Patient has been provided with contact information for the Managed Medicaid care management team and has been advised to call with any health related questions or concerns.   Abelino Derrick, MHA [facility]  Managed [facility] Social Worker [phone]

## 2023-08-10 NOTE — Patient Instructions (Signed)
 Visit Information  Brian Weber was given information about Medicaid Managed Care team care coordination services as a part of their [facility] Medicaid benefit. Brian Weber verbally consented to engagement with the [facility] Managed Care team.   If you are experiencing a medical emergency, please call 911 or report to your local emergency department or urgent care.   If you have a non-emergency medical problem during routine business hours, please contact your provider's office and ask to speak with a nurse.   For questions related to your [facility] health plan, please call: [phone] or go here:[email or web address]  If you would like to schedule transportation through your [facility] plan, please call the following number at least 2 days in advance of your appointment: [phone].   You can also use the MTM portal or MTM mobile app to manage your rides. Reimbursement for transportation is available through [facility]! For the portal, please go to [email or web address]  Call the [facility] Crisis Line at [phone], at any time, 24 hours a day, 7 days a week. If you are in danger or need immediate medical attention call 911.  If you would like help to quit smoking, call 1-800-QUIT-NOW (276 087 9387) OR Espaol: 1-855-Djelo-Ya (7-253-664-4034) o para ms informacin haga clic aqu or Text READY to 742-595 to register via text  Mr. Gomillion - following are the goals we discussed in your visit today:   Goals Addressed   None      The  Patient                                              has been provided with contact information for the Managed Medicaid care management team and has been advised to call with any health related questions or concerns.   Brian Weber, Brian Weber, MHA [facility]  Managed Medicaid Social Worker [phone]   Following is a copy of your plan of care:  There are no care plans that you recently modified to display for this  patient.

## 2023-08-11 ENCOUNTER — Ambulatory Visit (INDEPENDENT_AMBULATORY_CARE_PROVIDER_SITE_OTHER): Payer: Medicaid Other | Admitting: Physician Assistant

## 2023-08-11 DIAGNOSIS — F4310 Post-traumatic stress disorder, unspecified: Secondary | ICD-10-CM | POA: Diagnosis not present

## 2023-08-11 DIAGNOSIS — F3160 Bipolar disorder, current episode mixed, unspecified: Secondary | ICD-10-CM

## 2023-08-11 DIAGNOSIS — F411 Generalized anxiety disorder: Secondary | ICD-10-CM

## 2023-08-11 MED ORDER — BENZTROPINE MESYLATE 1 MG PO TABS: 1.0000 mg | ORAL_TABLET | Freq: Every day | ORAL | 1 refills | Status: DC

## 2023-08-11 MED ORDER — OLANZAPINE 10 MG PO TABS: 10.0000 mg | ORAL_TABLET | Freq: Every day | ORAL | 1 refills | Status: DC

## 2023-08-11 MED ORDER — VENLAFAXINE HCL ER 150 MG PO CP24: 150.0000 mg | ORAL_CAPSULE | Freq: Every day | ORAL | 1 refills | Status: DC

## 2023-08-11 MED ORDER — PRAZOSIN HCL 2 MG PO CAPS: 2.0000 mg | ORAL_CAPSULE | Freq: Every day | ORAL | 1 refills | Status: DC
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
## 2023-08-14 ENCOUNTER — Encounter (HOSPITAL_COMMUNITY): Payer: Self-pay | Admitting: Physician Assistant

## 2023-09-18 DIAGNOSIS — Z419 Encounter for procedure for purposes other than remedying health state, unspecified: Secondary | ICD-10-CM | POA: Diagnosis not present

## 2023-09-22 ENCOUNTER — Encounter (HOSPITAL_COMMUNITY): Payer: Self-pay | Admitting: Physician Assistant

## 2023-09-22 ENCOUNTER — Ambulatory Visit (INDEPENDENT_AMBULATORY_CARE_PROVIDER_SITE_OTHER): Admitting: Physician Assistant

## 2023-09-22 VITALS — BP 148/76 | HR 104 | Temp 98.1°F | Ht 67.0 in | Wt 166.8 lb

## 2023-09-22 DIAGNOSIS — F3160 Bipolar disorder, current episode mixed, unspecified: Secondary | ICD-10-CM | POA: Diagnosis not present

## 2023-09-22 DIAGNOSIS — Z79899 Other long term (current) drug therapy: Secondary | ICD-10-CM

## 2023-09-22 DIAGNOSIS — F4310 Post-traumatic stress disorder, unspecified: Secondary | ICD-10-CM

## 2023-09-22 DIAGNOSIS — F411 Generalized anxiety disorder: Secondary | ICD-10-CM | POA: Diagnosis not present

## 2023-09-22 MED ORDER — BENZTROPINE MESYLATE 1 MG PO TABS: 1.0000 mg | ORAL_TABLET | Freq: Every day | ORAL | 1 refills | Status: DC

## 2023-09-22 MED ORDER — OLANZAPINE 10 MG PO TABS: 10.0000 mg | ORAL_TABLET | Freq: Every day | ORAL | 1 refills | Status: DC

## 2023-09-22 MED ORDER — VENLAFAXINE HCL ER 150 MG PO CP24: 150.0000 mg | ORAL_CAPSULE | Freq: Every day | ORAL | 1 refills | Status: DC

## 2023-09-22 MED ORDER — PRAZOSIN HCL 2 MG PO CAPS
2.0000 mg | ORAL_CAPSULE | Freq: Every day | ORAL | 1 refills | Status: DC
Start: 2023-09-22 — End: 2023-12-23
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
## 2023-10-18 DIAGNOSIS — Z419 Encounter for procedure for purposes other than remedying health state, unspecified: Secondary | ICD-10-CM | POA: Diagnosis not present

## 2023-11-01 ENCOUNTER — Other Ambulatory Visit: Payer: Self-pay

## 2023-11-01 ENCOUNTER — Emergency Department (HOSPITAL_COMMUNITY)
Admission: EM | Admit: 2023-11-01 | Discharge: 2023-11-01 | Disposition: A | Discharge status: Home / Self Care | Attending: Emergency Medicine | Admitting: Emergency Medicine

## 2023-11-01 DIAGNOSIS — Z79899 Other long term (current) drug therapy: Secondary | ICD-10-CM | POA: Insufficient documentation

## 2023-11-01 DIAGNOSIS — I10 Essential (primary) hypertension: Secondary | ICD-10-CM | POA: Insufficient documentation

## 2023-11-01 DIAGNOSIS — E039 Hypothyroidism, unspecified: Secondary | ICD-10-CM | POA: Insufficient documentation

## 2023-11-01 DIAGNOSIS — R55 Syncope and collapse: Secondary | ICD-10-CM | POA: Insufficient documentation

## 2023-11-01 DIAGNOSIS — F17200 Nicotine dependence, unspecified, uncomplicated: Secondary | ICD-10-CM | POA: Insufficient documentation

## 2023-11-01 DIAGNOSIS — Z79890 Hormone replacement therapy (postmenopausal): Secondary | ICD-10-CM | POA: Diagnosis not present

## 2023-11-01 LAB — COMPREHENSIVE METABOLIC PANEL WITH GFR
ALT: 10 U/L (ref 0–44)
AST: 19 U/L (ref 15–41)
Albumin: 3.7 g/dL (ref 3.5–5.0)
Alkaline Phosphatase: 71 U/L (ref 38–126)
Anion gap: 8 (ref 5–15)
BUN: 11 mg/dL (ref 6–20)
CO2: 26 mmol/L (ref 22–32)
Calcium: 9 mg/dL (ref 8.9–10.3)
Chloride: 106 mmol/L (ref 98–111)
Creatinine, Ser: 1.53 mg/dL — ABNORMAL HIGH (ref 0.61–1.24)
GFR, Estimated: 52 mL/min — ABNORMAL LOW (ref >60)
Glucose, Bld: 80 mg/dL (ref 70–99)
Potassium: 3.8 mmol/L (ref 3.5–5.1)
Sodium: 140 mmol/L (ref 135–145)
Total Bilirubin: 0.3 mg/dL (ref 0.0–1.2)
Total Protein: 6.9 g/dL (ref 6.5–8.1)

## 2023-11-01 LAB — URINALYSIS, ROUTINE W REFLEX MICROSCOPIC
Bilirubin Urine: NEGATIVE
Glucose, UA: NEGATIVE mg/dL
Hgb urine dipstick: NEGATIVE
Ketones, ur: NEGATIVE mg/dL
Leukocytes,Ua: NEGATIVE
Nitrite: NEGATIVE
Protein, ur: NEGATIVE mg/dL
Specific Gravity, Urine: 1.005 (ref 1.005–1.030)
pH: 7 (ref 5.0–8.0)

## 2023-11-01 LAB — CBC
HCT: 39.8 % (ref 39.0–52.0)
Hemoglobin: 12.7 g/dL — ABNORMAL LOW (ref 13.0–17.0)
MCH: 28 pg (ref 26.0–34.0)
MCHC: 31.9 g/dL (ref 30.0–36.0)
MCV: 87.9 fL (ref 80.0–100.0)
Platelets: 230 10*3/uL (ref 150–400)
RBC: 4.53 MIL/uL (ref 4.22–5.81)
RDW: 14.9 % (ref 11.5–15.5)
WBC: 6.7 10*3/uL (ref 4.0–10.5)
nRBC: 0 % (ref 0.0–0.2)

## 2023-11-01 LAB — T4, FREE: Free T4: 0.67 ng/dL (ref 0.61–1.12)

## 2023-11-01 LAB — TSH: TSH: 14.494 u[IU]/mL — ABNORMAL HIGH (ref 0.350–4.500)

## 2023-11-01 LAB — CBG MONITORING, ED: Glucose-Capillary: 83 mg/dL (ref 70–99)

## 2023-11-01 MED ORDER — LEVOTHYROXINE SODIUM 112 MCG PO TABS: 112.0000 ug | ORAL_TABLET | Freq: Every day | ORAL | 2 refills | Status: DC

## 2023-11-01 MED ORDER — LACTATED RINGERS IV BOLUS
1000.0000 mL | Freq: Once | INTRAVENOUS | Status: AC
  Administered 2023-11-01: 1000 mL via INTRAVENOUS

## 2023-11-01 NOTE — ED Provider Notes (Signed)
 [place] EMERGENCY DEPARTMENT AT [facility] Provider Note   CSN: [id number] Arrival date & time: 11/01/23  1121     History  Chief Complaint  Patient presents with   Near Syncope    Brian Weber is a 58 y.o. male.  Pt is a 58 y.o. year old male with a history of hypothyroidism, hypertension, bipolar disorder, GAD, PTSD, prediabetes, Nicotine  dependence with recurrent syncope of uncertain etiology who follow up with cards but never had holter monitor due to cost who is presenting today with syncope x2.  Patient had 1 episode yesterday when he had just finished having a bowel movement.  He went to stand up and reports feeling very dizzy and passed out waking up on the floor and then today started feeling dizzy when he sat up on the couch and try to get up and passed out again.  He denies any chest pain, palpitations or shortness of breath before or after these events.  He reports he always feels it coming on he just cannot react fast enough to stop it.  He denies any injury.  He reports getting dentures about 6 months ago and states that he has lost a lot of weight since then because he has not been eating as much he is still trying to get used to them.  Also reports that he has been out of his Synthroid  for the last 3 months because he has not had a follow-up with his doctor until July and he has not gotten the medicine.  He denies any alcohol use or drug use.  He reports taking all of his mental health medications but denies taking any blood pressure medication.  Currently reports he feels okay.  The history is provided by the patient and medical records.  Near Syncope       Home Medications Prior to Admission medications   Medication Sig Start Date End Date Taking? Authorizing Provider  amLODipine  (NORVASC ) 10 MG tablet Take 1 tablet (10 mg total) by mouth daily. 06/24/23   Newlin, Enobong, MD  benztropine  (COGENTIN ) 1 MG tablet Take 1 tablet (1 mg total) by mouth at  bedtime. 09/22/23   Nwoko, Uchenna E, PA  Blood Pressure Monitoring (BLOOD PRESSURE KIT) DEVI Use to measure blood pressure 12/31/21   Vernell Goldsmith, MD  chlorthalidone  (HYGROTON ) 25 MG tablet Take 1 tablet (25 mg total) by mouth daily. 06/24/23   Newlin, Enobong, MD  cyclobenzaprine  (FLEXERIL ) 10 MG tablet Take 1 tablet (10 mg total) by mouth 3 (three) times daily as needed for muscle spasms. 03/31/23   Hassie Lint, PA-C  hydrOXYzine  (ATARAX ) 10 MG tablet Take 1 tablet (10 mg total) by mouth 3 (three) times daily as needed. 11/10/22   Nwoko, Uchenna E, PA  levothyroxine  (SYNTHROID ) 112 MCG tablet Take 1 tablet (112 mcg total) by mouth daily before breakfast. 11/01/23   Almond Army, MD  OLANZapine  (ZYPREXA ) 10 MG tablet Take 1 tablet (10 mg total) by mouth at bedtime. 09/22/23 09/21/24  Nwoko, Uchenna E, PA  prazosin  (MINIPRESS ) 2 MG capsule Take 1 capsule (2 mg total) by mouth at bedtime. 09/22/23   Nwoko, Uchenna E, PA  PRED FORTE  1 % ophthalmic suspension 2 drops each eye daily 06/18/22   Vernell Goldsmith, MD  venlafaxine  XR (EFFEXOR -XR) 150 MG 24 hr capsule Take 1 capsule (150 mg total) by mouth daily with breakfast. 09/22/23   Nwoko, Uchenna E, PA  venlafaxine  XR (EFFEXOR -XR) 37.5 MG 24 hr capsule Take  1 capsule (37.5 mg total) by mouth at bedtime for 6 days, THEN 2 capsules (75 mg total) at bedtime for 12 days. 06/22/23 07/10/23  Nwoko, Uchenna E, PA      Allergies    Patient has no known allergies.    Review of Systems   Review of Systems  Cardiovascular:  Positive for near-syncope.    Physical Exam Updated Vital Signs BP (!) 161/100   Pulse 60   Temp 98.3 F (36.8 C) (Oral)   Resp 14   Ht 5\' 7"  (1.702 m)   Wt 77.1 kg   SpO2 100%   BMI 26.63 kg/m  Physical Exam Vitals and nursing note reviewed.  Constitutional:      General: He is not in acute distress.    Appearance: He is well-developed.  HENT:     Head: Normocephalic and atraumatic.  Eyes:     Conjunctiva/sclera:  Conjunctivae normal.     Pupils: Pupils are equal, round, and reactive to light.  Cardiovascular:     Rate and Rhythm: Normal rate and regular rhythm.     Heart sounds: No murmur heard. Pulmonary:     Effort: Pulmonary effort is normal. No respiratory distress.     Breath sounds: Normal breath sounds. No wheezing or rales.  Abdominal:     General: There is no distension.     Palpations: Abdomen is soft.     Tenderness: There is no abdominal tenderness. There is no guarding or rebound.  Musculoskeletal:        General: No tenderness. Normal range of motion.     Cervical back: Normal range of motion and neck supple.  Skin:    General: Skin is warm and dry.     Findings: No erythema or rash.  Neurological:     Mental Status: He is alert and oriented to person, place, and time.  Psychiatric:        Behavior: Behavior normal.     ED Results / Procedures / Treatments   Labs (all labs ordered are listed, but only abnormal results are displayed) Labs Reviewed  COMPREHENSIVE METABOLIC PANEL WITH GFR - Abnormal; Notable for the following components:      Result Value   Creatinine, Ser 1.53 (*)    GFR, Estimated 52 (*)    All other components within normal limits  CBC - Abnormal; Notable for the following components:   Hemoglobin 12.7 (*)    All other components within normal limits  URINALYSIS, ROUTINE W REFLEX MICROSCOPIC - Abnormal; Notable for the following components:   Color, Urine STRAW (*)    All other components within normal limits  TSH - Abnormal; Notable for the following components:   TSH 14.494 (*)    All other components within normal limits  T4, FREE  CBG MONITORING, ED    EKG EKG Interpretation Date/Time:  Monday Nov 01 2023 11:39:12 EDT Ventricular Rate:  71 PR Interval:  157 QRS Duration:  86 QT Interval:  374 QTC Calculation: 407 R Axis:   68  Text Interpretation: Sinus rhythm Normal ECG Confirmed by Almond Army ([id number]) on 11/01/2023 12:22:08  PM  Radiology No results found.  Procedures Procedures    Medications Ordered in ED Medications  lactated ringers bolus 1,000 mL (0 mLs Intravenous Stopped 11/01/23 1400)    ED Course/ Medical Decision Making/ A&P  Medical Decision Making Amount and/or Complexity of Data Reviewed External Data Reviewed: notes. Labs: ordered. Decision-making details documented in ED Course. Radiology: ordered and independent interpretation performed. Decision-making details documented in ED Course. ECG/medicine tests: ordered and independent interpretation performed. Decision-making details documented in ED Course.  Risk Prescription drug management.   Pt with multiple medical problems and comorbidities and presenting today with a complaint that caries a high risk for morbidity and mortality.  Here today with 2 episodes of syncope in the last 2 days.  Seems to be positional in nature.  Denies chest pain or shortness of breath prior to the episodes.  Has prodromal symptoms.  Has been seen in the past by cardiology and PCP for this but did not get complete workup because of the price of Holter monitor.  Patient does report weight loss and eating less due to recently getting dentures in the last 6 months.           Final Clinical Impression(s) / ED Diagnoses Final diagnoses:  Syncope and collapse  Hypothyroidism, unspecified type    Rx / DC Orders ED Discharge Orders          Ordered    levothyroxine  (SYNTHROID ) 112 MCG tablet  Daily before breakfast        11/01/23 1411              Almond Army, MD 11/01/23 1413

## 2023-11-01 NOTE — Discharge Instructions (Addendum)
 All your labs are normal including your kidneys.  Your thyroid  studies are a bit abnormal but we will get you back on your thyroid  medication.  You were given refills as well.  Plan on following up with your doctor as planned but you will need to see her sooner if you have any further episodes of passing out.  Make sure you are getting plenty of nutrition and calories.

## 2023-11-01 NOTE — ED Triage Notes (Signed)
 PT complains of having passing out spells for past 2 days. Denies any injury. Yesterday after using bathroom went to stand and "blacked out" Today around 530 this morning just after waking up fell onto floor. Denies any pain. No blood thinners. PT states this happened last year and at that time had a change in thyroid  medication. Pt states he has been out of thyroid  medication for 3 months.

## 2023-11-03 ENCOUNTER — Encounter (HOSPITAL_COMMUNITY): Admitting: Physician Assistant

## 2023-11-18 DIAGNOSIS — Z419 Encounter for procedure for purposes other than remedying health state, unspecified: Secondary | ICD-10-CM | POA: Diagnosis not present

## 2023-12-18 DIAGNOSIS — Z419 Encounter for procedure for purposes other than remedying health state, unspecified: Secondary | ICD-10-CM | POA: Diagnosis not present

## 2023-12-21 ENCOUNTER — Telehealth: Payer: Self-pay | Admitting: Critical Care Medicine

## 2023-12-21 NOTE — Telephone Encounter (Signed)
 Called pt to confirm appt. Pt's phone is unavailable.

## 2023-12-22 ENCOUNTER — Ambulatory Visit: Payer: Medicaid Other | Attending: Family Medicine | Admitting: Family Medicine

## 2023-12-22 ENCOUNTER — Encounter: Payer: Self-pay | Admitting: Family Medicine

## 2023-12-22 VITALS — BP 138/89 | HR 64 | Ht 67.0 in | Wt 169.8 lb

## 2023-12-22 DIAGNOSIS — I10 Essential (primary) hypertension: Secondary | ICD-10-CM

## 2023-12-22 DIAGNOSIS — R55 Syncope and collapse: Secondary | ICD-10-CM

## 2023-12-22 DIAGNOSIS — F3160 Bipolar disorder, current episode mixed, unspecified: Secondary | ICD-10-CM

## 2023-12-22 DIAGNOSIS — E039 Hypothyroidism, unspecified: Secondary | ICD-10-CM | POA: Diagnosis not present

## 2023-12-22 MED ORDER — AMLODIPINE BESYLATE 10 MG PO TABS: 10.0000 mg | ORAL_TABLET | Freq: Every day | ORAL | 1 refills | Status: DC

## 2023-12-22 MED ORDER — CHLORTHALIDONE 25 MG PO TABS: 25.0000 mg | ORAL_TABLET | Freq: Every day | ORAL | 1 refills | Status: DC

## 2023-12-22 NOTE — Patient Instructions (Signed)
 VISIT SUMMARY:  Today, we discussed your recurrent episodes of fainting, high blood pressure, thyroid  condition, and bipolar disorder. We reviewed your current medications and made plans for further evaluation and management.  YOUR PLAN:  -RECURRENT SYNCOPE: Recurrent syncope means you have been experiencing episodes of fainting. We will refer you to a cardiologist for further evaluation and to potentially get a heart monitor. Additionally, we will order a CT scan of your head to rule out any other causes.  -HYPERTENSION: Hypertension means high blood pressure. Your blood pressure has improved with your current medications, chlorthalidone  and amlodipine . We will continue these medications and provide you with a six-month supply.  -HYPOTHYROIDISM: Hypothyroidism means your thyroid  gland is not producing enough thyroid  hormone. We will order a thyroid  function test to check your hormone levels and adjust your levothyroxine  dosage if needed.  -BIPOLAR 1 DISORDER, MIXED: Bipolar 1 disorder is a mental health condition characterized by extreme mood swings. You are currently on Effexor , Zyprexa , Seroquel, and prazosin . We will continue your current psychiatric medications as prescribed.  INSTRUCTIONS:  Please follow up with a cardiologist for further evaluation of your fainting episodes and to discuss the possibility of getting a heart monitor. Also, get a CT scan of your head as ordered. Make sure to get your thyroid  function test done so we can adjust your levothyroxine  dosage if needed. Continue taking your current medications for high blood pressure and bipolar disorder as prescribed. If you have any questions or concerns, please contact our office.
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
## 2023-12-23 ENCOUNTER — Ambulatory Visit: Payer: Self-pay | Admitting: Family Medicine

## 2023-12-23 ENCOUNTER — Ambulatory Visit (HOSPITAL_COMMUNITY): Admitting: Physician Assistant

## 2023-12-23 DIAGNOSIS — F411 Generalized anxiety disorder: Secondary | ICD-10-CM

## 2023-12-23 DIAGNOSIS — F3160 Bipolar disorder, current episode mixed, unspecified: Secondary | ICD-10-CM

## 2023-12-23 DIAGNOSIS — F4310 Post-traumatic stress disorder, unspecified: Secondary | ICD-10-CM

## 2023-12-23 DIAGNOSIS — E039 Hypothyroidism, unspecified: Secondary | ICD-10-CM

## 2023-12-23 MED ORDER — VENLAFAXINE HCL ER 150 MG PO CP24
150.0000 mg | ORAL_CAPSULE | Freq: Every day | ORAL | 1 refills | Status: AC
Start: 2023-12-23 — End: ?

## 2023-12-23 MED ORDER — OLANZAPINE 10 MG PO TABS: 10.0000 mg | ORAL_TABLET | Freq: Every day | ORAL | 1 refills | Status: AC

## 2023-12-23 MED ORDER — PRAZOSIN HCL 2 MG PO CAPS: 2.0000 mg | ORAL_CAPSULE | Freq: Every day | ORAL | 1 refills | Status: AC

## 2023-12-23 MED ORDER — BENZTROPINE MESYLATE 1 MG PO TABS: 1.0000 mg | ORAL_TABLET | Freq: Every day | ORAL | 1 refills | Status: AC

## 2023-12-23 MED ORDER — LEVOTHYROXINE SODIUM 112 MCG PO TABS: 112.0000 ug | ORAL_TABLET | Freq: Every day | ORAL | 1 refills | Status: DC
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
## 2023-12-24 LAB — T4, FREE: Free T4: 1.56 ng/dL (ref 0.82–1.77)

## 2023-12-24 LAB — T3: T3, Total: 105 ng/dL (ref 71–180)

## 2023-12-24 LAB — TSH: TSH: 1.72 u[IU]/mL (ref 0.450–4.500)

## 2023-12-24 MED ORDER — LEVOTHYROXINE SODIUM 112 MCG PO TABS: 112.0000 ug | ORAL_TABLET | Freq: Every day | ORAL | 1 refills | Status: DC

## 2023-12-24 NOTE — Addendum Note (Signed)
 Addended by: Job Holtsclaw on: 12/24/2023 10:50 AM   Modules accepted: Orders

## 2024-01-10 ENCOUNTER — Telehealth: Payer: Self-pay

## 2024-01-10 NOTE — Telephone Encounter (Signed)
 Copied from CRM [id number]. Topic: General - Billing Inquiry >> Jan 10, 2024  1:14 PM Brian Weber wrote: Reason for CRM: Bas[place], Social Worker from [employer] life skills and resources [facility] called requesting to speak to the office regarding the patient's medicaid status. The patient has given permission for the office to speak to her  Best contact: [phone]

## 2024-01-13 NOTE — Telephone Encounter (Signed)
 Bascom was called and she states that patient wants to change insurance, I informed her that the patient will need to reach out to [facility] and discuss insurance changes.

## 2024-01-16 ENCOUNTER — Encounter (HOSPITAL_COMMUNITY): Payer: Self-pay | Admitting: Physician Assistant

## 2024-01-18 DIAGNOSIS — Z419 Encounter for procedure for purposes other than remedying health state, unspecified: Secondary | ICD-10-CM | POA: Diagnosis not present

## 2024-02-09 ENCOUNTER — Encounter (HOSPITAL_COMMUNITY): Admitting: Physician Assistant

## 2024-02-18 DIAGNOSIS — Z419 Encounter for procedure for purposes other than remedying health state, unspecified: Secondary | ICD-10-CM | POA: Diagnosis not present

## 2024-04-10 ENCOUNTER — Encounter: Payer: Self-pay | Admitting: Radiology

## 2024-04-19 DIAGNOSIS — Z419 Encounter for procedure for purposes other than remedying health state, unspecified: Secondary | ICD-10-CM | POA: Diagnosis not present

## 2024-06-23 ENCOUNTER — Telehealth: Payer: Self-pay | Admitting: Family Medicine

## 2024-06-23 NOTE — Telephone Encounter (Signed)
 Called cannot be complete as dial.Contacted pt left  no vm to confirmed appt !

## 2024-06-26 ENCOUNTER — Encounter: Payer: Self-pay | Admitting: Family Medicine

## 2024-06-26 ENCOUNTER — Ambulatory Visit: Attending: Family Medicine | Admitting: Family Medicine

## 2024-06-26 VITALS — BP 115/78 | HR 84 | Temp 98.3°F | Ht 67.0 in | Wt 173.6 lb

## 2024-06-26 DIAGNOSIS — E039 Hypothyroidism, unspecified: Secondary | ICD-10-CM

## 2024-06-26 DIAGNOSIS — I10 Essential (primary) hypertension: Secondary | ICD-10-CM | POA: Diagnosis not present

## 2024-06-26 DIAGNOSIS — F3160 Bipolar disorder, current episode mixed, unspecified: Secondary | ICD-10-CM | POA: Diagnosis not present

## 2024-06-26 DIAGNOSIS — R7303 Prediabetes: Secondary | ICD-10-CM

## 2024-06-26 LAB — POCT GLYCOSYLATED HEMOGLOBIN (HGB A1C): HbA1c, POC (controlled diabetic range): 6.3 % (ref 0.0–7.0)

## 2024-06-26 MED ORDER — METFORMIN HCL 500 MG PO TABS: 500.0000 mg | ORAL_TABLET | Freq: Every day | ORAL | 1 refills | Status: AC

## 2024-06-26 MED ORDER — AMLODIPINE BESYLATE 10 MG PO TABS: 10.0000 mg | ORAL_TABLET | Freq: Every day | ORAL | 1 refills | Status: AC

## 2024-06-26 MED ORDER — CHLORTHALIDONE 25 MG PO TABS: 25.0000 mg | ORAL_TABLET | Freq: Every day | ORAL | 1 refills | Status: AC

## 2024-06-26 NOTE — Patient Instructions (Signed)
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
## 2024-06-27 ENCOUNTER — Ambulatory Visit: Payer: Self-pay | Admitting: Family Medicine

## 2024-06-27 DIAGNOSIS — E039 Hypothyroidism, unspecified: Secondary | ICD-10-CM

## 2024-06-27 LAB — TSH: TSH: 1.31 u[IU]/mL (ref 0.450–4.500)

## 2024-06-27 LAB — CMP14+EGFR
ALT: 13 IU/L (ref 0–44)
AST: 21 IU/L (ref 0–40)
Albumin: 4.3 g/dL (ref 3.8–4.9)
Alkaline Phosphatase: 120 IU/L (ref 47–123)
BUN/Creatinine Ratio: 9 (ref 9–20)
BUN: 11 mg/dL (ref 6–24)
Bilirubin Total: 0.4 mg/dL (ref 0.0–1.2)
CO2: 24 mmol/L (ref 20–29)
Calcium: 9.8 mg/dL (ref 8.7–10.2)
Chloride: 107 mmol/L — ABNORMAL HIGH (ref 96–106)
Creatinine, Ser: 1.29 mg/dL — ABNORMAL HIGH (ref 0.76–1.27)
Globulin, Total: 2.8 g/dL (ref 1.5–4.5)
Glucose: 102 mg/dL — ABNORMAL HIGH (ref 70–99)
Potassium: 4.5 mmol/L (ref 3.5–5.2)
Sodium: 144 mmol/L (ref 134–144)
Total Protein: 7.1 g/dL (ref 6.0–8.5)
eGFR: 64 mL/min/1.73

## 2024-06-27 LAB — LP+NON-HDL CHOLESTEROL
Cholesterol, Total: 127 mg/dL (ref 100–199)
HDL: 49 mg/dL
LDL Chol Calc (NIH): 64 mg/dL (ref 0–99)
Total Non-HDL-Chol (LDL+VLDL): 78 mg/dL (ref 0–129)
Triglycerides: 70 mg/dL (ref 0–149)
VLDL Cholesterol Cal: 14 mg/dL (ref 5–40)

## 2024-06-27 LAB — T3: T3, Total: 117 ng/dL (ref 71–180)

## 2024-06-27 LAB — T4, FREE: Free T4: 1.51 ng/dL (ref 0.82–1.77)

## 2024-06-27 MED ORDER — LEVOTHYROXINE SODIUM 112 MCG PO TABS: 112.0000 ug | ORAL_TABLET | Freq: Every day | ORAL | 1 refills | Status: AC

## 2024-12-25 ENCOUNTER — Ambulatory Visit: Payer: Self-pay | Admitting: Family Medicine
